# Patient Record
Sex: Male | Born: 1940 | Race: White | Hispanic: No | Marital: Married | Smoking: Former smoker
Health system: Southern US, Community
[De-identification: ages and names within clinical notes are randomized; demographics above are authoritative.]

## PROBLEM LIST (undated history)

## (undated) DIAGNOSIS — E039 Hypothyroidism, unspecified: Secondary | ICD-10-CM

## (undated) DIAGNOSIS — M109 Gout, unspecified: Secondary | ICD-10-CM

## (undated) DIAGNOSIS — E785 Hyperlipidemia, unspecified: Secondary | ICD-10-CM

## (undated) DIAGNOSIS — I10 Essential (primary) hypertension: Secondary | ICD-10-CM

## (undated) DIAGNOSIS — R3129 Other microscopic hematuria: Secondary | ICD-10-CM

## (undated) DIAGNOSIS — E291 Testicular hypofunction: Secondary | ICD-10-CM

## (undated) DIAGNOSIS — N289 Disorder of kidney and ureter, unspecified: Secondary | ICD-10-CM

## (undated) HISTORY — DX: Essential (primary) hypertension: I10

## (undated) HISTORY — PX: FLEXIBLE SIGMOIDOSCOPY: SHX1649

## (undated) HISTORY — DX: Gout, unspecified: M10.9

## (undated) HISTORY — DX: Other microscopic hematuria: R31.29

## (undated) HISTORY — DX: Hyperlipidemia, unspecified: E78.5

---

## 2000-09-26 HISTORY — PX: CHOLECYSTECTOMY: SHX55

## 2006-11-27 ENCOUNTER — Ambulatory Visit: Payer: Self-pay | Admitting: Gastroenterology

## 2006-11-27 HISTORY — PX: COLONOSCOPY: SHX174

## 2011-02-20 ENCOUNTER — Emergency Department: Payer: Self-pay | Admitting: Emergency Medicine

## 2014-10-14 DIAGNOSIS — J069 Acute upper respiratory infection, unspecified: Secondary | ICD-10-CM | POA: Diagnosis not present

## 2014-11-05 DIAGNOSIS — E079 Disorder of thyroid, unspecified: Secondary | ICD-10-CM | POA: Diagnosis not present

## 2014-11-11 DIAGNOSIS — Z23 Encounter for immunization: Secondary | ICD-10-CM | POA: Diagnosis not present

## 2014-11-11 DIAGNOSIS — Z0001 Encounter for general adult medical examination with abnormal findings: Secondary | ICD-10-CM | POA: Diagnosis not present

## 2014-11-28 DIAGNOSIS — H65193 Other acute nonsuppurative otitis media, bilateral: Secondary | ICD-10-CM | POA: Diagnosis not present

## 2014-12-02 DIAGNOSIS — J029 Acute pharyngitis, unspecified: Secondary | ICD-10-CM | POA: Diagnosis not present

## 2014-12-15 DIAGNOSIS — M10041 Idiopathic gout, right hand: Secondary | ICD-10-CM | POA: Diagnosis not present

## 2015-02-11 DIAGNOSIS — Z125 Encounter for screening for malignant neoplasm of prostate: Secondary | ICD-10-CM | POA: Diagnosis not present

## 2015-02-11 DIAGNOSIS — G8929 Other chronic pain: Secondary | ICD-10-CM | POA: Diagnosis not present

## 2015-02-11 DIAGNOSIS — M25511 Pain in right shoulder: Secondary | ICD-10-CM | POA: Diagnosis not present

## 2015-02-11 DIAGNOSIS — Z1283 Encounter for screening for malignant neoplasm of skin: Secondary | ICD-10-CM | POA: Diagnosis not present

## 2015-03-05 DIAGNOSIS — H60393 Other infective otitis externa, bilateral: Secondary | ICD-10-CM | POA: Diagnosis not present

## 2015-03-05 DIAGNOSIS — H6123 Impacted cerumen, bilateral: Secondary | ICD-10-CM | POA: Diagnosis not present

## 2015-03-05 DIAGNOSIS — H6060 Unspecified chronic otitis externa, unspecified ear: Secondary | ICD-10-CM | POA: Diagnosis not present

## 2015-05-04 DIAGNOSIS — Z125 Encounter for screening for malignant neoplasm of prostate: Secondary | ICD-10-CM | POA: Diagnosis not present

## 2015-05-04 DIAGNOSIS — I10 Essential (primary) hypertension: Secondary | ICD-10-CM | POA: Diagnosis not present

## 2015-05-04 DIAGNOSIS — E079 Disorder of thyroid, unspecified: Secondary | ICD-10-CM | POA: Diagnosis not present

## 2015-05-11 DIAGNOSIS — I10 Essential (primary) hypertension: Secondary | ICD-10-CM | POA: Diagnosis not present

## 2015-05-11 DIAGNOSIS — E079 Disorder of thyroid, unspecified: Secondary | ICD-10-CM | POA: Diagnosis not present

## 2015-05-11 DIAGNOSIS — N289 Disorder of kidney and ureter, unspecified: Secondary | ICD-10-CM | POA: Diagnosis not present

## 2015-05-26 DIAGNOSIS — M5441 Lumbago with sciatica, right side: Secondary | ICD-10-CM | POA: Diagnosis not present

## 2015-06-02 DIAGNOSIS — L821 Other seborrheic keratosis: Secondary | ICD-10-CM | POA: Diagnosis not present

## 2015-06-02 DIAGNOSIS — L570 Actinic keratosis: Secondary | ICD-10-CM | POA: Diagnosis not present

## 2015-06-02 DIAGNOSIS — X32XXXA Exposure to sunlight, initial encounter: Secondary | ICD-10-CM | POA: Diagnosis not present

## 2015-10-02 DIAGNOSIS — J069 Acute upper respiratory infection, unspecified: Secondary | ICD-10-CM | POA: Diagnosis not present

## 2015-10-02 DIAGNOSIS — B9789 Other viral agents as the cause of diseases classified elsewhere: Secondary | ICD-10-CM | POA: Diagnosis not present

## 2015-10-19 DIAGNOSIS — M109 Gout, unspecified: Secondary | ICD-10-CM | POA: Diagnosis not present

## 2015-11-09 DIAGNOSIS — E079 Disorder of thyroid, unspecified: Secondary | ICD-10-CM | POA: Diagnosis not present

## 2015-11-09 DIAGNOSIS — I10 Essential (primary) hypertension: Secondary | ICD-10-CM | POA: Diagnosis not present

## 2015-11-16 DIAGNOSIS — Z0001 Encounter for general adult medical examination with abnormal findings: Secondary | ICD-10-CM | POA: Diagnosis not present

## 2015-11-16 DIAGNOSIS — I10 Essential (primary) hypertension: Secondary | ICD-10-CM | POA: Diagnosis not present

## 2015-11-16 DIAGNOSIS — E7800 Pure hypercholesterolemia, unspecified: Secondary | ICD-10-CM | POA: Diagnosis not present

## 2015-11-16 DIAGNOSIS — D649 Anemia, unspecified: Secondary | ICD-10-CM | POA: Diagnosis not present

## 2015-11-16 DIAGNOSIS — E079 Disorder of thyroid, unspecified: Secondary | ICD-10-CM | POA: Diagnosis not present

## 2015-11-16 DIAGNOSIS — N289 Disorder of kidney and ureter, unspecified: Secondary | ICD-10-CM | POA: Diagnosis not present

## 2015-11-20 DIAGNOSIS — D649 Anemia, unspecified: Secondary | ICD-10-CM | POA: Diagnosis not present

## 2016-05-11 DIAGNOSIS — N289 Disorder of kidney and ureter, unspecified: Secondary | ICD-10-CM | POA: Diagnosis not present

## 2016-05-11 DIAGNOSIS — D649 Anemia, unspecified: Secondary | ICD-10-CM | POA: Diagnosis not present

## 2016-05-18 DIAGNOSIS — I10 Essential (primary) hypertension: Secondary | ICD-10-CM | POA: Diagnosis not present

## 2016-05-18 DIAGNOSIS — Z125 Encounter for screening for malignant neoplasm of prostate: Secondary | ICD-10-CM | POA: Diagnosis not present

## 2016-05-18 DIAGNOSIS — E7800 Pure hypercholesterolemia, unspecified: Secondary | ICD-10-CM | POA: Diagnosis not present

## 2016-05-18 DIAGNOSIS — N289 Disorder of kidney and ureter, unspecified: Secondary | ICD-10-CM | POA: Diagnosis not present

## 2016-06-06 DIAGNOSIS — M545 Low back pain: Secondary | ICD-10-CM | POA: Diagnosis not present

## 2016-06-06 DIAGNOSIS — M5136 Other intervertebral disc degeneration, lumbar region: Secondary | ICD-10-CM | POA: Diagnosis not present

## 2016-06-07 DIAGNOSIS — X32XXXA Exposure to sunlight, initial encounter: Secondary | ICD-10-CM | POA: Diagnosis not present

## 2016-06-07 DIAGNOSIS — D2261 Melanocytic nevi of right upper limb, including shoulder: Secondary | ICD-10-CM | POA: Diagnosis not present

## 2016-06-07 DIAGNOSIS — Z85828 Personal history of other malignant neoplasm of skin: Secondary | ICD-10-CM | POA: Diagnosis not present

## 2016-06-07 DIAGNOSIS — D225 Melanocytic nevi of trunk: Secondary | ICD-10-CM | POA: Diagnosis not present

## 2016-06-07 DIAGNOSIS — D2272 Melanocytic nevi of left lower limb, including hip: Secondary | ICD-10-CM | POA: Diagnosis not present

## 2016-06-07 DIAGNOSIS — L570 Actinic keratosis: Secondary | ICD-10-CM | POA: Diagnosis not present

## 2016-07-20 DIAGNOSIS — Z0000 Encounter for general adult medical examination without abnormal findings: Secondary | ICD-10-CM | POA: Diagnosis not present

## 2016-09-05 DIAGNOSIS — H109 Unspecified conjunctivitis: Secondary | ICD-10-CM | POA: Diagnosis not present

## 2016-09-07 DIAGNOSIS — I10 Essential (primary) hypertension: Secondary | ICD-10-CM | POA: Diagnosis not present

## 2016-09-07 DIAGNOSIS — B028 Zoster with other complications: Secondary | ICD-10-CM | POA: Diagnosis not present

## 2016-09-12 DIAGNOSIS — B0230 Zoster ocular disease, unspecified: Secondary | ICD-10-CM | POA: Diagnosis not present

## 2016-09-22 DIAGNOSIS — B0230 Zoster ocular disease, unspecified: Secondary | ICD-10-CM | POA: Diagnosis not present

## 2016-10-14 DIAGNOSIS — B0230 Zoster ocular disease, unspecified: Secondary | ICD-10-CM | POA: Diagnosis not present

## 2016-10-18 DIAGNOSIS — J40 Bronchitis, not specified as acute or chronic: Secondary | ICD-10-CM | POA: Diagnosis not present

## 2016-10-18 DIAGNOSIS — B028 Zoster with other complications: Secondary | ICD-10-CM | POA: Diagnosis not present

## 2016-10-18 DIAGNOSIS — I10 Essential (primary) hypertension: Secondary | ICD-10-CM | POA: Diagnosis not present

## 2016-11-11 DIAGNOSIS — Z125 Encounter for screening for malignant neoplasm of prostate: Secondary | ICD-10-CM | POA: Diagnosis not present

## 2016-11-11 DIAGNOSIS — I10 Essential (primary) hypertension: Secondary | ICD-10-CM | POA: Diagnosis not present

## 2016-11-11 DIAGNOSIS — E7800 Pure hypercholesterolemia, unspecified: Secondary | ICD-10-CM | POA: Diagnosis not present

## 2016-11-18 DIAGNOSIS — N289 Disorder of kidney and ureter, unspecified: Secondary | ICD-10-CM | POA: Diagnosis not present

## 2016-11-18 DIAGNOSIS — E034 Atrophy of thyroid (acquired): Secondary | ICD-10-CM | POA: Diagnosis not present

## 2016-11-18 DIAGNOSIS — R3121 Asymptomatic microscopic hematuria: Secondary | ICD-10-CM | POA: Diagnosis not present

## 2016-11-18 DIAGNOSIS — D649 Anemia, unspecified: Secondary | ICD-10-CM | POA: Diagnosis not present

## 2016-11-18 DIAGNOSIS — I10 Essential (primary) hypertension: Secondary | ICD-10-CM | POA: Diagnosis not present

## 2016-11-18 DIAGNOSIS — E7800 Pure hypercholesterolemia, unspecified: Secondary | ICD-10-CM | POA: Diagnosis not present

## 2016-11-18 DIAGNOSIS — Z0001 Encounter for general adult medical examination with abnormal findings: Secondary | ICD-10-CM | POA: Diagnosis not present

## 2016-11-18 DIAGNOSIS — E790 Hyperuricemia without signs of inflammatory arthritis and tophaceous disease: Secondary | ICD-10-CM | POA: Diagnosis not present

## 2016-11-18 DIAGNOSIS — E538 Deficiency of other specified B group vitamins: Secondary | ICD-10-CM | POA: Diagnosis not present

## 2016-11-22 DIAGNOSIS — M10071 Idiopathic gout, right ankle and foot: Secondary | ICD-10-CM | POA: Diagnosis not present

## 2016-12-05 ENCOUNTER — Ambulatory Visit: Payer: Self-pay | Admitting: Urology

## 2016-12-18 NOTE — Progress Notes (Signed)
12/19/2016 2:53 PM   Bryan Martin [birth year] [id number]  Referring provider: Madelyn Brunner, MD [name], [place] [address]  Chief Complaint  Patient presents with  . New Patient (Initial Visit)    microscopic hematuria referred by [name]    HPI: Patient is a 76 -year-old Caucasian male who presents today as a referral from their PCP, Dr. Lisette Grinder, for microscopic hematuria.    Patient was found to have microscopic hematuria on 11/11/2016 with 4-10 RBC's/hpf.  Patient doesn't have a prior history of microscopic hematuria.   He does not have a prior history of recurrent urinary tract infections, nephrolithiasis, trauma to the genitourinary tract, BPH or malignancies of the genitourinary tract.   He does not have a family medical history of nephrolithiasis, malignancies of the genitourinary tract or hematuria.   Today, he is having symptoms of frequent urination and nocturia.  His UA today is unremarkable.  He is not experiencing any suprapubic pain, abdominal pain or flank pain. He denies any recent fevers, chills, nausea or vomiting.   He has not had any recent imaging studies.   He is a former smoker, with a 1 ppd history.  Quit 40 years ago.  He is not exposed to secondhand smoke.  He has not worked with Sports administrator, trichloroethylene, etc.    He has HTN.    PMH: Past Medical History:  Diagnosis Date  . Gout   . HLD (hyperlipidemia)   . HTN (hypertension)   . Microscopic hematuria     Surgical History: History reviewed. No pertinent surgical history.  Home Medications:  Allergies as of 12/19/2016      Reactions   Ace Inhibitors Other (See Comments)      Medication List       Accurate as of 12/19/16  2:53 PM. Always use your most recent med list.          Acetaminophen 500 MG coapsule   allopurinol 100 MG tablet Commonly known as:  ZYLOPRIM Take by mouth.   aspirin EC 81 MG tablet Take by  mouth.   felodipine 5 MG 24 hr tablet Commonly known as:  PLENDIL TAKE 1 TABLET ONE TIME DAILY (SUBSTITUTED FOR PLENDIL)   fluticasone 50 MCG/ACT nasal spray Commonly known as:  FLONASE Place into the nose.   gabapentin 100 MG capsule Commonly known as:  NEURONTIN Take by mouth.   levothyroxine 25 MCG tablet Commonly known as:  SYNTHROID, LEVOTHROID Take by mouth.   MULTI-VITAMINS Tabs Take by mouth.   saw palmetto 500 MG capsule Take 500 mg by mouth daily.   simvastatin 40 MG tablet Commonly known as:  ZOCOR TAKE 1 TABLET EVERY NIGHT   tiZANidine 4 MG tablet Commonly known as:  ZANAFLEX Take by mouth.   valsartan-hydrochlorothiazide 160-12.5 MG tablet Commonly known as:  DIOVAN-HCT TAKE 1 TABLET EVERY DAY   VITAMIN B 12 PO Take by mouth.       Allergies:  Allergies  Allergen Reactions  . Ace Inhibitors Other (See Comments)    Family History: Family History  Problem Relation Age of Onset  . Prostate cancer Neg Hx   . Kidney cancer Neg Hx   . Bladder Cancer Neg Hx   . Kidney disease Neg Hx     Social History:  reports that he has quit smoking. He has never used smokeless tobacco. He reports that he does not drink alcohol or use drugs.  ROS:  UROLOGY Frequent Urination?: Yes Hard to postpone urination?: No Burning/pain with urination?: No Get up at night to urinate?: Yes Leakage of urine?: No Urine stream starts and stops?: No Trouble starting stream?: No Do you have to strain to urinate?: No Blood in urine?: Yes Urinary tract infection?: No Sexually transmitted disease?: No Injury to kidneys or bladder?: No Painful intercourse?: No Weak stream?: No Erection problems?: No Penile pain?: No  Gastrointestinal Nausea?: No Vomiting?: No Indigestion/heartburn?: No Diarrhea?: No Constipation?: No  Constitutional Fever: No Night sweats?: No Weight loss?: No Fatigue?: No  Skin Skin rash/lesions?: No Itching?: No  Eyes Blurred  vision?: No Double vision?: No  Ears/Nose/Throat Sore throat?: No Sinus problems?: No  Hematologic/Lymphatic Swollen glands?: No Easy bruising?: No  Cardiovascular Leg swelling?: No Chest pain?: No  Respiratory Cough?: No Shortness of breath?: No  Endocrine Excessive thirst?: No  Musculoskeletal Back pain?: No Joint pain?: No  Neurological Headaches?: No Dizziness?: No  Psychologic Depression?: No Anxiety?: No  Physical Exam: BP 119/72   Pulse 79   Ht 5' 11.5" (1.816 m)   Wt 186 lb 11.2 oz (84.7 kg)   BMI 25.68 kg/m   Constitutional: Well nourished. Alert and oriented, No acute distress. HEENT: [place] AT, moist mucus membranes. Trachea midline, no masses. Cardiovascular: No clubbing, cyanosis, or edema. Respiratory: Normal respiratory effort, no increased work of breathing. GI: Abdomen is soft, non tender, non distended, no abdominal masses. Liver and spleen not palpable.  No hernias appreciated.  Stool sample for occult testing is not indicated.   GU: No CVA tenderness.  No bladder fullness or masses.  Patient with circumcised phallus.  Urethral meatus is patent.  No penile discharge. No penile lesions or rashes. Scrotum without lesions, cysts, rashes and/or edema.  Testicles are located scrotally bilaterally. No masses are appreciated in the testicles. Left and right epididymis are normal. Rectal: Patient with  normal sphincter tone. Anus and perineum without scarring or rashes. No rectal masses are appreciated. Prostate is approximately 60 grams, no nodules are appreciated. Seminal vesicles are normal. Skin: No rashes, bruises or suspicious lesions. Lymph: No cervical or inguinal adenopathy. Neurologic: Grossly intact, no focal deficits, moving all 4 extremities. Psychiatric: Normal mood and affect.  Laboratory Data: PSA History  3.55 ng/mL on 05/04/2015  3.57 ng/mL on 11/11/2016  Urinalysis Unremarkable.  See EPIC.     Assessment & Plan:    1.  Microscopic hematuria  - I explained to the patient that there are a number of causes that can be associated with blood in the urine, such as stones, BPH, UTI's, damage to the urinary tract and/or cancer.  - At this time, I felt that the patient warranted further urologic evaluation.   The AUA guidelines state that a CT urogram is the preferred imaging study to evaluate hematuria.  - I explained to the patient that a contrast material will be injected into a vein and that in rare instances, an allergic reaction can result and may even life threatening   The patient denies any allergies to contrast, iodine and/or seafood and is not taking metformin.  - Following the imaging study,  I've recommended a cystoscopy. I described how this is performed, typically in an office setting with a flexible cystoscope. We described the risks, benefits, and possible side effects, the most common of which is a minor amount of blood in the urine and/or burning which usually resolves in 24 to 48 hours.    - The patient had the opportunity  to ask questions which were answered. Based upon this discussion, the patient is willing to proceed. Therefore, I've ordered: a CT Urogram and cystoscopy.  - The patient will return following all of the above for discussion of the results.   - UA  - Urine culture  - BUN + creatinine     Return for CT Urogram report and cystoscopy.  These notes generated with voice recognition software. I apologize for typographical errors.  Zara Council, [facility] [address], [place], [place] [address] [phone]

## 2016-12-19 ENCOUNTER — Ambulatory Visit (INDEPENDENT_AMBULATORY_CARE_PROVIDER_SITE_OTHER): Payer: Medicare HMO | Admitting: Urology

## 2016-12-19 ENCOUNTER — Encounter: Payer: Self-pay | Admitting: Urology

## 2016-12-19 VITALS — BP 119/72 | HR 79 | Ht 71.5 in | Wt 186.7 lb

## 2016-12-19 DIAGNOSIS — R3129 Other microscopic hematuria: Secondary | ICD-10-CM

## 2016-12-19 LAB — URINALYSIS, COMPLETE
Bilirubin, UA: NEGATIVE
GLUCOSE, UA: NEGATIVE
KETONES UA: NEGATIVE
NITRITE UA: NEGATIVE
PROTEIN UA: NEGATIVE
Urobilinogen, Ur: 0.2 mg/dL (ref 0.2–1.0)
pH, UA: 5.5 (ref 5.0–7.5)

## 2016-12-19 LAB — MICROSCOPIC EXAMINATION

## 2016-12-19 NOTE — Patient Instructions (Addendum)
Hematuria, Adult Hematuria is blood in your urine. It can be caused by a bladder infection, kidney infection, prostate infection, kidney stone, or cancer of your urinary tract. Infections can usually be treated with medicine, and a kidney stone usually will pass through your urine. If neither of these is the cause of your hematuria, further workup to find out the reason may be needed. It is very important that you tell your health care provider about any blood you see in your urine, even if the blood stops without treatment or happens without causing pain. Blood in your urine that happens and then stops and then happens again can be a symptom of a very serious condition. Also, pain is not a symptom in the initial stages of many urinary cancers. Follow these instructions at home:  Drink lots of fluid, 3-4 quarts a day. If you have been diagnosed with an infection, cranberry juice is especially recommended, in addition to large amounts of water.  Avoid caffeine, tea, and carbonated beverages because they tend to irritate the bladder.  Avoid alcohol because it may irritate the prostate.  Take all medicines as directed by your health care provider.  If you were prescribed an antibiotic medicine, finish it all even if you start to feel better.  If you have been diagnosed with a kidney stone, follow your health care provider's instructions regarding straining your urine to catch the stone.  Empty your bladder often. Avoid holding urine for long periods of time.  After a bowel movement, women should cleanse front to back. Use each tissue only once.  Empty your bladder before and after sexual intercourse if you are a male. Contact a health care provider if:  You develop back pain.  You have a fever.  You have a feeling of sickness in your stomach (nausea) or vomiting.  Your symptoms are not better in 3 days. Return sooner if you are getting worse. Get help right away if:  You develop  severe vomiting and are unable to keep the medicine down.  You develop severe back or abdominal pain despite taking your medicines.  You begin passing a large amount of blood or clots in your urine.  You feel extremely weak or faint, or you pass out. This information is not intended to replace advice given to you by your health care provider. Make sure you discuss any questions you have with your health care provider. Document Released: 09/12/2005 Document Revised: 02/18/2016 Document Reviewed: 05/13/2013 Elsevier Interactive Patient Education  2017 [place] scan is a kind of X-ray. A CT scan makes pictures of the inside of your body. In this procedure, the pictures will be taken in a large machine that has an opening (CT scanner). What happens before the procedure? Staying hydrated  Follow instructions from your doctor about hydration, which may include:  Up to 2 hours before the procedure - you may continue to drink clear liquids. These include water, clear fruit juice, black coffee, and plain tea. Eating and drinking restrictions  Follow instructions from your doctor about eating and drinking, which may include:  24 hours before the procedure - stop drinking caffeinated drinks. These include energy drinks, tea, soda, coffee, and hot chocolate.  8 hours before the procedure - stop eating heavy meals or foods. These include meat, fried foods, or fatty foods.  6 hours before the procedure - stop eating light meals or foods. These include toast or cereal.  6 hours before the procedure -  stop drinking milk or drinks that have milk in them.  2 hours before the procedure - stop drinking clear liquids. General instructions   Take off any jewelry.  Ask your doctor about changing or stopping your normal medicines. This is important if you take diabetes medicines or blood thinners. What happens during the procedure?  You will lie on a table with your arms above your  head.  An IV tube may be put into one of your veins.  Dye may be put into the IV tube. You may feel warm or have a metal taste in your mouth.  The table you will be lying on will move into the CT scanner.  You will be able to see, hear, and talk to the person who is running the machine while you are in it. Follow that person's directions.  The machine will move around you to take pictures. Do not move.  When the machine is done taking pictures, it will be turned off.  The table will be moved out of the machine.  Your IV tube will be taken out. The procedure may vary among doctors and hospitals. What happens after the procedure?  It is up to you to get your results. Ask when your results will be ready. Summary  A CT scan is a kind of X-ray.  A CT scan makes pictures of the inside of your body.  Follow instructions from your doctor about eating and drinking before the procedure.  You will be able to see, hear, and talk to the person who is running the machine while you are in it. Follow that person's directions. This information is not intended to replace advice given to you by your health care provider. Make sure you discuss any questions you have with your health care provider. Document Released: 12/09/2008 Document Revised: 09/29/2016 Document Reviewed: 09/29/2016 Elsevier Interactive Patient Education  2017 [place].  Cystoscopy Cystoscopy is a procedure that is used to help diagnose and sometimes treat conditions that affect that lower urinary tract. The lower urinary tract includes the bladder and the tube that drains urine from the bladder out of the body (urethra). Cystoscopy is performed with a thin, tube-shaped instrument with a light and camera at the end (cystoscope). The cystoscope may be hard (rigid) or flexible, depending on the goal of the procedure.The cystoscope is inserted through the urethra, into the bladder. Cystoscopy may be recommended if you  have:  Urinary tractinfections that keep coming back (recurring).  Blood in the urine (hematuria).  Loss of bladder control (urinary incontinence) or an overactive bladder.  Unusual cells found in a urine sample.  A blockage in the urethra.  Painful urination.  An abnormality in the bladder found during an intravenous pyelogram (IVP) or CT scan. Cystoscopy may also be done to remove a sample of tissue to be examined under a microscope (biopsy). Tell a health care provider about:  Any allergies you have.  All medicines you are taking, including vitamins, herbs, eye drops, creams, and over-the-counter medicines.  Any problems you or family members have had with anesthetic medicines.  Any blood disorders you have.  Any surgeries you have had.  Any medical conditions you have.  Whether you are pregnant or may be pregnant. What are the risks? Generally, this is a safe procedure. However, problems may occur, including:  Infection.  Bleeding.  Allergic reactions to medicines.  Damage to other structures or organs. What happens before the procedure?  Ask your health care provider  about:  Changing or stopping your regular medicines. This is especially important if you are taking diabetes medicines or blood thinners.  Taking medicines such as aspirin and ibuprofen. These medicines can thin your blood. Do not take these medicines before your procedure if your health care provider instructs you not to.  Follow instructions from your health care provider about eating or drinking restrictions.  You may be given antibiotic medicine to help prevent infection.  You may have an exam or testing, such as X-rays of the bladder, urethra, or kidneys.  You may have urine tests to check for signs of infection.  Plan to have someone take you home after the procedure. What happens during the procedure?  To reduce your risk of infection,your health care team will wash or sanitize  their hands.  You will be given one or more of the following:  A medicine to help you relax (sedative).  A medicine to numb the area (local anesthetic).  The area around the opening of your urethra will be cleaned.  The cystoscope will be passed through your urethra into your bladder.  Germ-free (sterile)fluid will flow through the cystoscope to fill your bladder. The fluid will stretch your bladder so that your surgeon can clearly examine your bladder walls.  The cystoscope will be removed and your bladder will be emptied. The procedure may vary among health care providers and hospitals. What happens after the procedure?  You may have some soreness or pain in your abdomen and urethra. Medicines will be available to help you.  You may have some blood in your urine.  Do not drive for 24 hours if you received a sedative. This information is not intended to replace advice given to you by your health care provider. Make sure you discuss any questions you have with your health care provider. Document Released: 09/09/2000 Document Revised: 01/21/2016 Document Reviewed: 07/30/2015 Elsevier Interactive Patient Education  2017 [employer].

## 2016-12-20 LAB — BUN+CREAT
BUN/Creatinine Ratio: 15 (ref 10–24)
BUN: 26 mg/dL (ref 8–27)
Creatinine, Ser: 1.73 mg/dL — ABNORMAL HIGH (ref 0.76–1.27)
GFR calc Af Amer: 44 mL/min/{1.73_m2} — ABNORMAL LOW (ref >59)
GFR, EST NON AFRICAN AMERICAN: 38 mL/min/{1.73_m2} — AB (ref >59)

## 2016-12-22 LAB — CULTURE, URINE COMPREHENSIVE

## 2016-12-27 ENCOUNTER — Ambulatory Visit
Admission: RE | Admit: 2016-12-27 | Discharge: 2016-12-27 | Disposition: A | Discharge status: Home / Self Care | Payer: Medicare HMO | Source: Ambulatory Visit | Attending: Urology | Admitting: Urology

## 2016-12-27 DIAGNOSIS — R3129 Other microscopic hematuria: Secondary | ICD-10-CM | POA: Diagnosis not present

## 2016-12-27 DIAGNOSIS — I700 Atherosclerosis of aorta: Secondary | ICD-10-CM | POA: Diagnosis not present

## 2016-12-27 DIAGNOSIS — N400 Enlarged prostate without lower urinary tract symptoms: Secondary | ICD-10-CM | POA: Diagnosis not present

## 2016-12-27 DIAGNOSIS — N281 Cyst of kidney, acquired: Secondary | ICD-10-CM | POA: Diagnosis not present

## 2016-12-27 MED ORDER — IOPAMIDOL (ISOVUE-300) INJECTION 61%
100.0000 mL | Freq: Once | INTRAVENOUS | Status: AC | PRN
  Administered 2016-12-27: 100 mL via INTRAVENOUS

## 2017-01-04 DIAGNOSIS — B0230 Zoster ocular disease, unspecified: Secondary | ICD-10-CM | POA: Diagnosis not present

## 2017-01-11 ENCOUNTER — Ambulatory Visit: Payer: Medicare HMO | Admitting: Urology

## 2017-01-11 ENCOUNTER — Encounter: Payer: Self-pay | Admitting: Urology

## 2017-01-11 VITALS — BP 115/70 | HR 78 | Ht 71.0 in | Wt 186.0 lb

## 2017-01-11 DIAGNOSIS — R3129 Other microscopic hematuria: Secondary | ICD-10-CM

## 2017-01-11 LAB — URINALYSIS, COMPLETE
BILIRUBIN UA: NEGATIVE
Glucose, UA: NEGATIVE
Ketones, UA: NEGATIVE
Leukocytes, UA: NEGATIVE
NITRITE UA: NEGATIVE
Protein, UA: NEGATIVE
Specific Gravity, UA: 1.01 (ref 1.005–1.030)
UUROB: 0.2 mg/dL (ref 0.2–1.0)
pH, UA: 5.5 (ref 5.0–7.5)

## 2017-01-11 MED ORDER — CIPROFLOXACIN HCL 500 MG PO TABS
500.0000 mg | ORAL_TABLET | Freq: Once | ORAL | Status: AC
  Administered 2017-01-11: 500 mg via ORAL

## 2017-01-11 MED ORDER — LIDOCAINE HCL 2 % EX GEL
1.0000 "application " | Freq: Once | CUTANEOUS | Status: AC
  Administered 2017-01-11: 1 via URETHRAL

## 2017-01-11 NOTE — Progress Notes (Addendum)
   01/11/17  CC:  Chief Complaint  Patient presents with  . Cysto    microscopic hematuria     HPI: The patient is a 76 year old male presents today for completion of the microscopic hematuria workup. No history of gross hematuria. CT Urogram negative for source. CT did reveal nonmalignant renal cyst.  There were no vitals taken for this visit. NED. A&Ox3.   No respiratory distress   Abd soft, NT, ND Normal external genitalia with patent urethral meatus  Cystoscopy Procedure Note  Patient identification was confirmed, informed consent was obtained, and patient was prepped using Betadine solution.  Lidocaine jelly was administered per urethral meatus.    Preoperative abx where received prior to procedure.     Pre-Procedure: - Inspection reveals a normal caliber ureteral meatus.  Procedure: The flexible cystoscope was introduced without difficulty - No urethral strictures/lesions are present. - Enlarged prostate Visually obstructive - 5 cm - Normal bladder neck - Bilateral ureteral orifices identified - Bladder mucosa  reveals no ulcers, tumors, or lesions - No bladder stones - No trabeculation  Retroflexion shows no intravesical lobe   Post-Procedure: - Patient tolerated the procedure well  Assessment/ Plan:  1. Microscopic hematuria -Negative work up. Follow up for repeat urinalysis in one year.  2. Bilateral renal cysts -benign. No further work up  Nickie Retort, MD

## 2017-01-30 DIAGNOSIS — M109 Gout, unspecified: Secondary | ICD-10-CM | POA: Diagnosis not present

## 2017-02-17 ENCOUNTER — Other Ambulatory Visit: Payer: Self-pay | Admitting: Gastroenterology

## 2017-02-17 DIAGNOSIS — R0989 Other specified symptoms and signs involving the circulatory and respiratory systems: Secondary | ICD-10-CM

## 2017-02-17 DIAGNOSIS — Z1211 Encounter for screening for malignant neoplasm of colon: Secondary | ICD-10-CM | POA: Diagnosis not present

## 2017-02-23 ENCOUNTER — Ambulatory Visit
Admission: RE | Admit: 2017-02-23 | Discharge: 2017-02-23 | Disposition: A | Discharge status: Home / Self Care | Payer: Medicare HMO | Source: Ambulatory Visit | Attending: Gastroenterology | Admitting: Gastroenterology

## 2017-02-23 DIAGNOSIS — R0989 Other specified symptoms and signs involving the circulatory and respiratory systems: Secondary | ICD-10-CM | POA: Insufficient documentation

## 2017-02-23 DIAGNOSIS — N281 Cyst of kidney, acquired: Secondary | ICD-10-CM | POA: Insufficient documentation

## 2017-05-19 DIAGNOSIS — E7800 Pure hypercholesterolemia, unspecified: Secondary | ICD-10-CM | POA: Diagnosis not present

## 2017-05-19 DIAGNOSIS — D649 Anemia, unspecified: Secondary | ICD-10-CM | POA: Diagnosis not present

## 2017-05-19 DIAGNOSIS — I10 Essential (primary) hypertension: Secondary | ICD-10-CM | POA: Diagnosis not present

## 2017-05-19 DIAGNOSIS — E034 Atrophy of thyroid (acquired): Secondary | ICD-10-CM | POA: Diagnosis not present

## 2017-05-26 DIAGNOSIS — E034 Atrophy of thyroid (acquired): Secondary | ICD-10-CM | POA: Diagnosis not present

## 2017-05-26 DIAGNOSIS — I10 Essential (primary) hypertension: Secondary | ICD-10-CM | POA: Diagnosis not present

## 2017-05-30 DIAGNOSIS — X32XXXA Exposure to sunlight, initial encounter: Secondary | ICD-10-CM | POA: Diagnosis not present

## 2017-05-30 DIAGNOSIS — D2261 Melanocytic nevi of right upper limb, including shoulder: Secondary | ICD-10-CM | POA: Diagnosis not present

## 2017-05-30 DIAGNOSIS — Z85828 Personal history of other malignant neoplasm of skin: Secondary | ICD-10-CM | POA: Diagnosis not present

## 2017-05-30 DIAGNOSIS — L570 Actinic keratosis: Secondary | ICD-10-CM | POA: Diagnosis not present

## 2017-05-30 DIAGNOSIS — D2272 Melanocytic nevi of left lower limb, including hip: Secondary | ICD-10-CM | POA: Diagnosis not present

## 2017-05-30 DIAGNOSIS — D2262 Melanocytic nevi of left upper limb, including shoulder: Secondary | ICD-10-CM | POA: Diagnosis not present

## 2017-06-06 DIAGNOSIS — J069 Acute upper respiratory infection, unspecified: Secondary | ICD-10-CM | POA: Diagnosis not present

## 2017-06-06 DIAGNOSIS — I10 Essential (primary) hypertension: Secondary | ICD-10-CM | POA: Diagnosis not present

## 2017-06-19 DIAGNOSIS — I10 Essential (primary) hypertension: Secondary | ICD-10-CM | POA: Diagnosis not present

## 2017-06-22 DIAGNOSIS — I10 Essential (primary) hypertension: Secondary | ICD-10-CM | POA: Diagnosis not present

## 2017-06-22 DIAGNOSIS — N289 Disorder of kidney and ureter, unspecified: Secondary | ICD-10-CM | POA: Diagnosis not present

## 2017-06-23 DIAGNOSIS — J069 Acute upper respiratory infection, unspecified: Secondary | ICD-10-CM | POA: Diagnosis not present

## 2017-06-23 DIAGNOSIS — M109 Gout, unspecified: Secondary | ICD-10-CM | POA: Diagnosis not present

## 2017-06-26 IMAGING — CT CT ABD-PEL WO/W CM
2 of 6 series · 13 of 32 positions shown, 18 images · IV contrast (APPLIED)
Comparison: None.

CLINICAL DATA: Microscopic hematuria

EXAM:
CT ABDOMEN AND PELVIS WITHOUT AND WITH CONTRAST
TECHNIQUE: Multidetector CT imaging of the abdomen and pelvis was performed
following the standard protocol before and following the bolus
administration of intravenous contrast.
CONTRAST:  100mL [id number] IOPAMIDOL ([id number]) INJECTION 61%

[Series 2: axial pre · axial · non-contrast · 0.75mm/px · z∈[-943,-618]mm · 6 of 92 slices shown]
[im 14/92  soft-tissue]
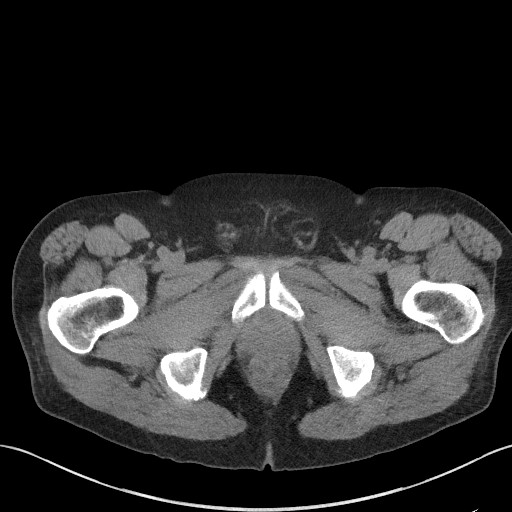
[im 27/92  soft-tissue]
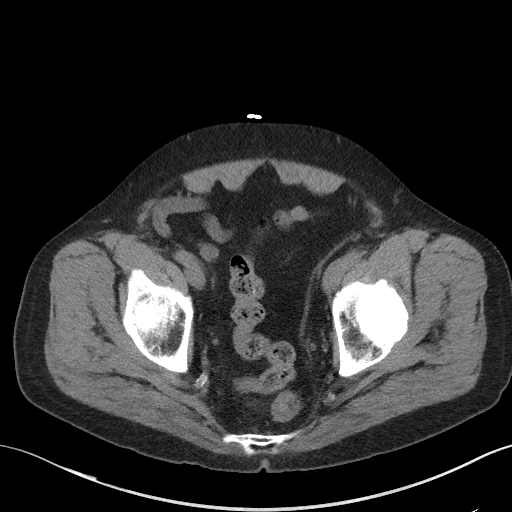
[im 40/92  soft-tissue]
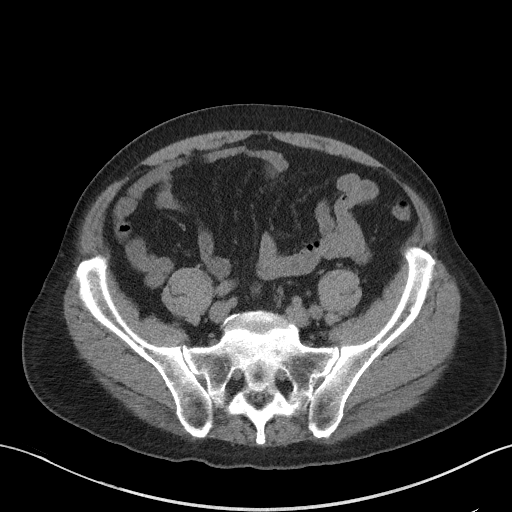
[im 53/92  soft-tissue]
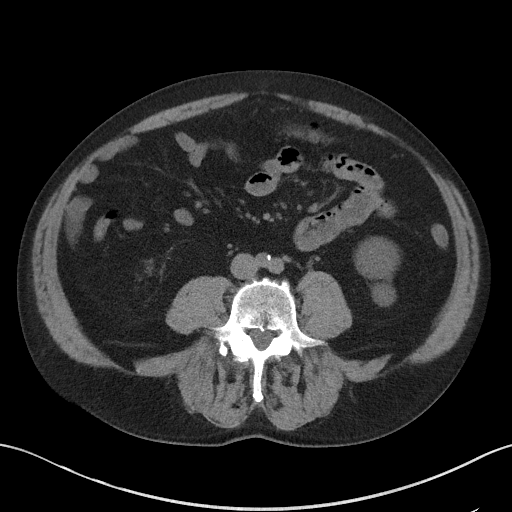
[im 66/92  soft-tissue]
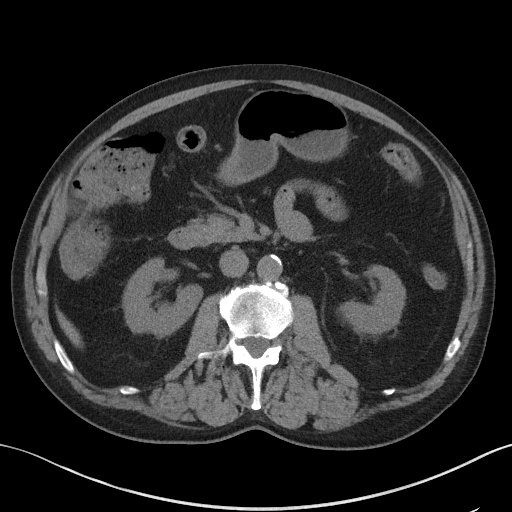
[im 79/92  soft-tissue]
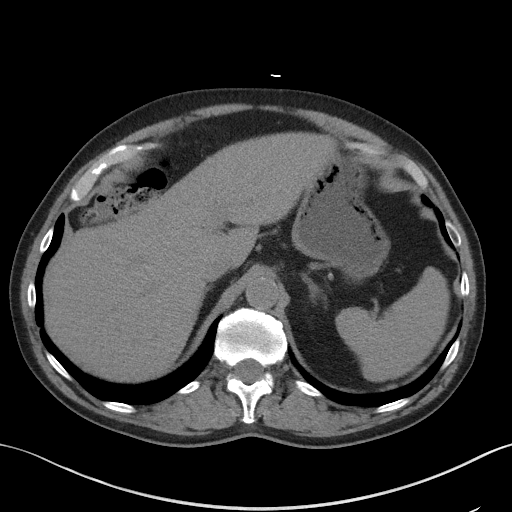

[Series 12: axial delay · axial · delayed · 0.75mm/px · z∈[-896,-531]mm · 7 of 99 slices shown, 12 images]
[im 13/99  soft-tissue]
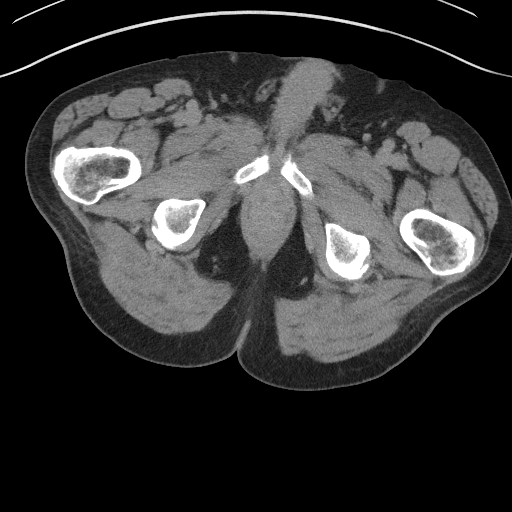
[im 13/99  bone]
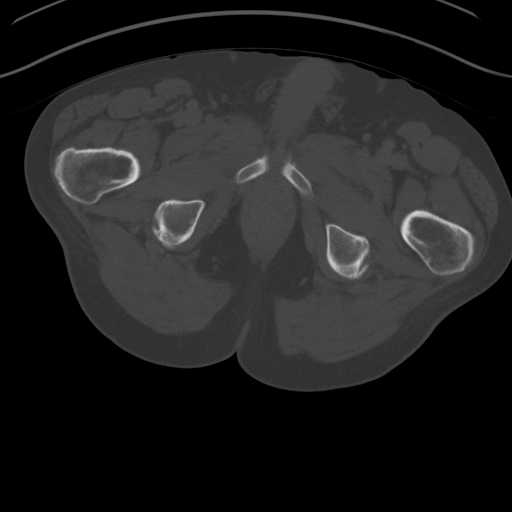
[im 25/99  soft-tissue]
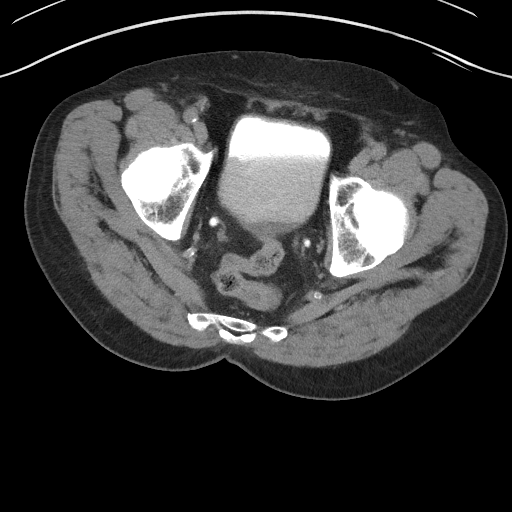
[im 37/99  soft-tissue]
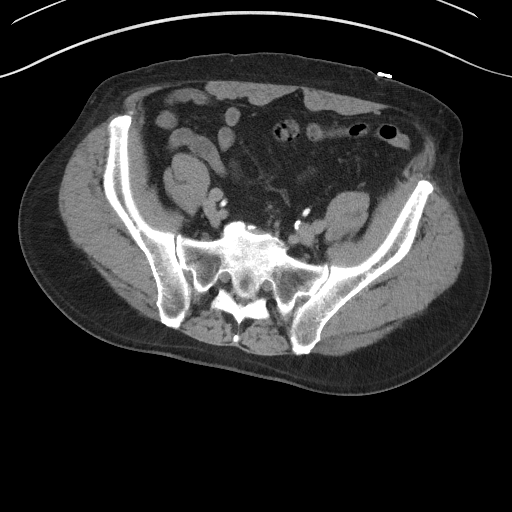
[im 50/99  soft-tissue]
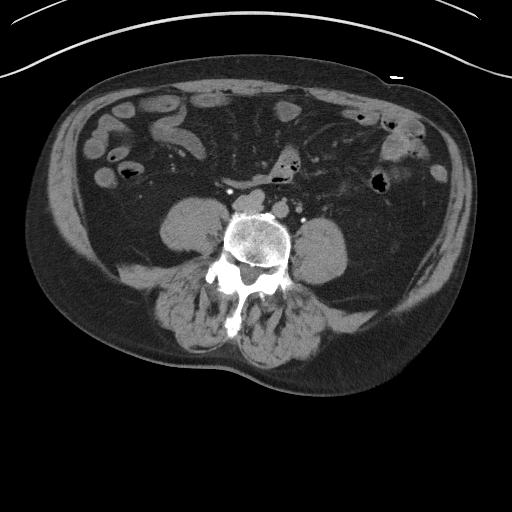
[im 50/99  lung]
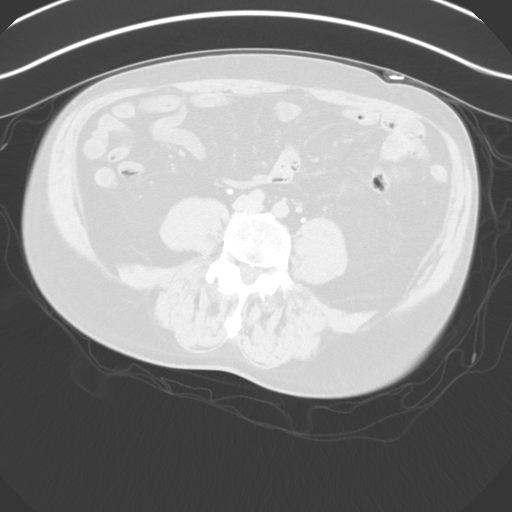
[im 62/99  soft-tissue]
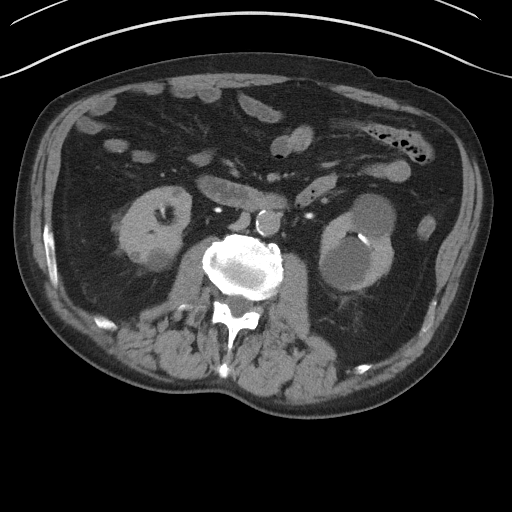
[im 62/99  lung]
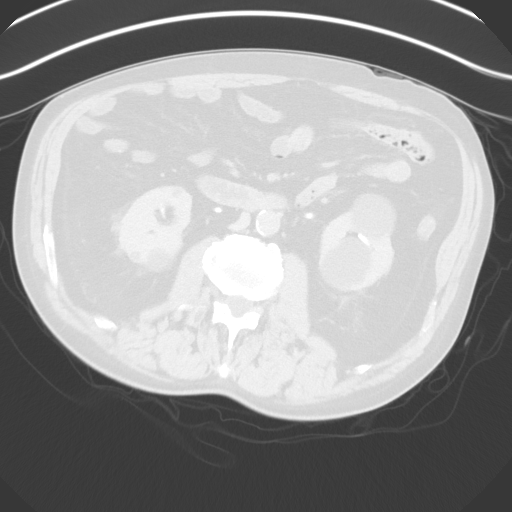
[im 74/99  soft-tissue]
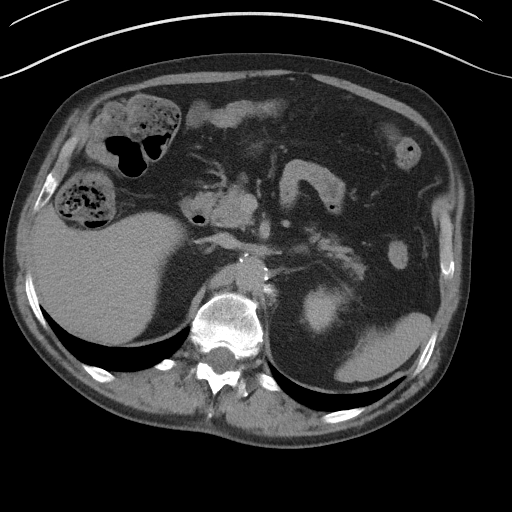
[im 74/99  lung]
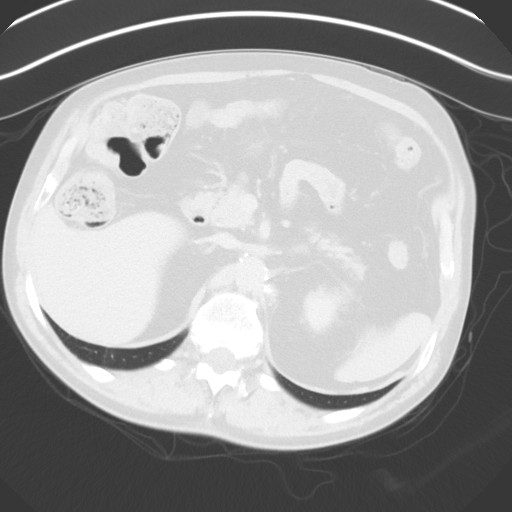
[im 86/99  soft-tissue]
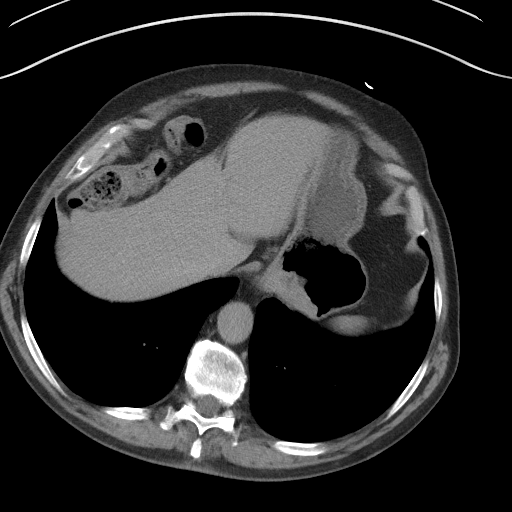
[im 86/99  lung]
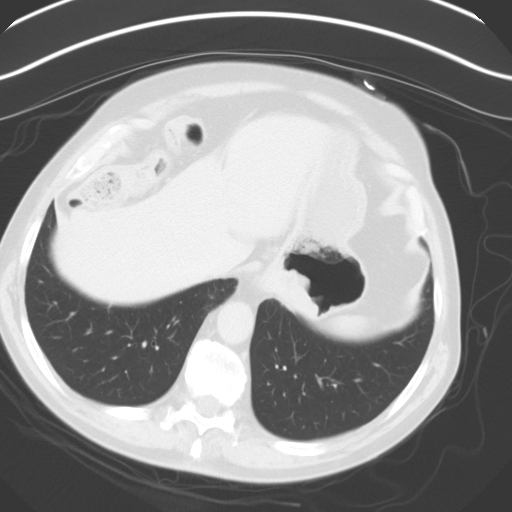

[13 of 32 positions shown; findings below may reference images not displayed]

FINDINGS: Lower chest:  Unremarkable.

Hepatobiliary: No focal abnormality within the liver parenchyma.
Gallbladder surgically absent. No intrahepatic or extrahepatic
biliary dilation.

Pancreas: No focal mass lesion. No dilatation of the main duct. No
intraparenchymal cyst. No peripancreatic edema.

Spleen: No splenomegaly. No focal mass lesion.

Adrenals/Urinary Tract: No adrenal nodule or mass. Precontrast
imaging shows no stones in either kidney or ureter. No bladder
stones.

Imaging after IV contrast administration no enhancing mass is
identified in either kidney. Multiple cysts are identified in the
right kidney ranging in size from 4 mm up to the dominant interpolar
lesion measuring 24 mm. These are all well-defined, homogeneous ,
and show no evidence of enhancement after IV contrast
administration, compatible with cyst.

No enhancing lesion identified left kidney. Multiple left renal
cysts are also evident ranging in size from 7 mm up to a dominant
4.8 cm exophytic lower pole lesion. The cysts have varying
attenuation on precontrast imaging but are homogeneous and 9 show
enhancement characteristics after contrast administration. 12 mm
exophytic upper pole lesion has increased attenuation compatible
with hemorrhagic or proteinaceous cyst (Bosniak II).

Delayed imaging shows no abnormality of either intrarenal collecting
system or renal pelvis. Ureters are unremarkable. No focal bladder
wall abnormality is evident with limited assessment of a small area
of the posterior bladder wall secondary to adjacent non-opacified
urine.

Stomach/Bowel: Stomach is nondistended. No gastric wall thickening.
No evidence of outlet obstruction. Duodenum is normally positioned
as is the ligament of Treitz. No small bowel wall thickening. No
small bowel dilatation. The terminal ileum is normal. The appendix
is normal. No gross colonic mass. No colonic wall thickening. No
substantial diverticular change.

Vascular/Lymphatic: There is abdominal aortic atherosclerosis
without aneurysm. There is no gastrohepatic or hepatoduodenal
ligament lymphadenopathy. No intraperitoneal or retroperitoneal
lymphadenopathy. No pelvic sidewall lymphadenopathy.

Reproductive: Prostate gland appears mildly enlarged.

Other: No intraperitoneal free fluid.

Musculoskeletal: Bone windows reveal no worrisome lytic or sclerotic
osseous lesions.
IMPRESSION: 1. No definite CT findings to explain the patient's history of
microhematuria. Bilateral renal cysts, some of which are complicated
by proteinaceous debris or hemorrhage, but no enhancing lesion in
either kidney. No renal stone disease. No ureteral or bladder
abnormality.
2.  Abdominal Aortic Atherosclerois ([id number])
3. Prostatomegaly.

## 2017-07-05 ENCOUNTER — Encounter: Payer: Self-pay | Admitting: *Deleted

## 2017-07-05 ENCOUNTER — Ambulatory Visit: Payer: Medicare HMO | Admitting: Anesthesiology

## 2017-07-05 ENCOUNTER — Encounter
Admission: RE | Disposition: A | Discharge status: Home / Self Care | Payer: Self-pay | Source: Ambulatory Visit | Attending: Internal Medicine

## 2017-07-05 ENCOUNTER — Ambulatory Visit
Admission: RE | Admit: 2017-07-05 | Discharge: 2017-07-05 | Disposition: A | Discharge status: Home / Self Care | Payer: Medicare HMO | Source: Ambulatory Visit | Attending: Internal Medicine | Admitting: Internal Medicine

## 2017-07-05 DIAGNOSIS — Z1211 Encounter for screening for malignant neoplasm of colon: Secondary | ICD-10-CM | POA: Diagnosis not present

## 2017-07-05 DIAGNOSIS — I10 Essential (primary) hypertension: Secondary | ICD-10-CM | POA: Insufficient documentation

## 2017-07-05 DIAGNOSIS — K648 Other hemorrhoids: Secondary | ICD-10-CM | POA: Diagnosis not present

## 2017-07-05 DIAGNOSIS — K640 First degree hemorrhoids: Secondary | ICD-10-CM | POA: Diagnosis not present

## 2017-07-05 DIAGNOSIS — E039 Hypothyroidism, unspecified: Secondary | ICD-10-CM | POA: Insufficient documentation

## 2017-07-05 DIAGNOSIS — Z7982 Long term (current) use of aspirin: Secondary | ICD-10-CM | POA: Insufficient documentation

## 2017-07-05 DIAGNOSIS — Z888 Allergy status to other drugs, medicaments and biological substances status: Secondary | ICD-10-CM | POA: Diagnosis not present

## 2017-07-05 DIAGNOSIS — M109 Gout, unspecified: Secondary | ICD-10-CM | POA: Diagnosis not present

## 2017-07-05 DIAGNOSIS — Z79899 Other long term (current) drug therapy: Secondary | ICD-10-CM | POA: Insufficient documentation

## 2017-07-05 DIAGNOSIS — E291 Testicular hypofunction: Secondary | ICD-10-CM | POA: Diagnosis not present

## 2017-07-05 DIAGNOSIS — E785 Hyperlipidemia, unspecified: Secondary | ICD-10-CM | POA: Insufficient documentation

## 2017-07-05 HISTORY — DX: Testicular hypofunction: E29.1

## 2017-07-05 HISTORY — PX: COLONOSCOPY WITH PROPOFOL: SHX5780

## 2017-07-05 HISTORY — DX: Disorder of kidney and ureter, unspecified: N28.9

## 2017-07-05 HISTORY — DX: Hypothyroidism, unspecified: E03.9

## 2017-07-05 SURGERY — COLONOSCOPY WITH PROPOFOL
Anesthesia: General

## 2017-07-05 MED ORDER — MIDAZOLAM HCL 2 MG/2ML IJ SOLN
INTRAMUSCULAR | Status: DC | PRN
  Administered 2017-07-05: 2 mg via INTRAVENOUS

## 2017-07-05 MED ORDER — PROPOFOL 500 MG/50ML IV EMUL
INTRAVENOUS | Status: AC
  Filled 2017-07-05: qty 50

## 2017-07-05 MED ORDER — SODIUM CHLORIDE 0.9 % IV SOLN
INTRAVENOUS | Status: DC
  Administered 2017-07-05: 13:00:00 via INTRAVENOUS

## 2017-07-05 MED ORDER — LIDOCAINE HCL (PF) 2 % IJ SOLN
INTRAMUSCULAR | Status: AC
  Filled 2017-07-05: qty 10

## 2017-07-05 MED ORDER — FENTANYL CITRATE (PF) 100 MCG/2ML IJ SOLN
INTRAMUSCULAR | Status: DC | PRN
  Administered 2017-07-05: 50 ug via INTRAVENOUS

## 2017-07-05 MED ORDER — PROPOFOL 500 MG/50ML IV EMUL
INTRAVENOUS | Status: DC | PRN
  Administered 2017-07-05: 100 ug/kg/min via INTRAVENOUS

## 2017-07-05 MED ORDER — GLYCOPYRROLATE 0.2 MG/ML IJ SOLN
INTRAMUSCULAR | Status: AC
  Filled 2017-07-05: qty 1

## 2017-07-05 MED ORDER — FENTANYL CITRATE (PF) 100 MCG/2ML IJ SOLN
INTRAMUSCULAR | Status: AC
  Filled 2017-07-05: qty 2

## 2017-07-05 MED ORDER — MIDAZOLAM HCL 2 MG/2ML IJ SOLN
INTRAMUSCULAR | Status: AC
  Filled 2017-07-05: qty 2

## 2017-07-05 NOTE — Transfer of Care (Signed)
Immediate Anesthesia Transfer of Care Note  Patient: Bryan Martin  Procedure(s) Performed: COLONOSCOPY WITH PROPOFOL (N/A )  Patient Location: PACU  Anesthesia Type:General  Level of Consciousness: awake and sedated  Airway & Oxygen Therapy: Patient Spontanous Breathing and Patient connected to nasal cannula oxygen  Post-op Assessment: Report given to RN and Post -op Vital signs reviewed and stable  Post vital signs: Reviewed and stable  Last Vitals:  Vitals:   07/05/17 1224  BP: (!) 149/71  Pulse: 76  Resp: 18  Temp: 36.5 C  SpO2: 100%    Last Pain:  Vitals:   07/05/17 1224  TempSrc: Tympanic         Complications: No apparent anesthesia complications

## 2017-07-05 NOTE — Anesthesia Post-op Follow-up Note (Signed)
Anesthesia QCDR form completed.        

## 2017-07-05 NOTE — Anesthesia Preprocedure Evaluation (Deleted)
Anesthesia Evaluation  Patient identified by MRN, date of birth, ID band Patient awake    Reviewed: Allergy & Precautions, NPO status , Patient's Chart, lab work & pertinent test results  History of Anesthesia Complications Negative for: history of anesthetic complications  Airway Mallampati: III       Dental   Pulmonary neg sleep apnea, neg COPD, former smoker,           Cardiovascular hypertension, Pt. on medications (-) Past MI and (-) CHF (-) dysrhythmias (-) Valvular Problems/Murmurs     Neuro/Psych neg Seizures CVA (Cerebellar, balnce difficulties)    GI/Hepatic negative GI ROS, Neg liver ROS,   Endo/Other  neg diabetesHypothyroidism   Renal/GU Renal InsufficiencyRenal disease     Musculoskeletal   Abdominal   Peds  Hematology   Anesthesia Other Findings   Reproductive/Obstetrics                             Anesthesia Physical Anesthesia Plan  ASA: III  Anesthesia Plan: General   Post-op Pain Management:    Induction: Intravenous  PONV Risk Score and Plan:   Airway Management Planned: Nasal Cannula  Additional Equipment:   Intra-op Plan:   Post-operative Plan:   Informed Consent: I have reviewed the patients History and Physical, chart, labs and discussed the procedure including the risks, benefits and alternatives for the proposed anesthesia with the patient or authorized representative who has indicated his/her understanding and acceptance.     Plan Discussed with:   Anesthesia Plan Comments:         Anesthesia Quick Evaluation

## 2017-07-05 NOTE — Interval H&P Note (Signed)
History and Physical Interval Note:  07/05/2017 1:59 PM  Bryan Martin  has presented today for surgery, with the diagnosis of SCREENING  The various methods of treatment have been discussed with the patient and family. After consideration of risks, benefits and other options for treatment, the patient has consented to  Procedure(s): COLONOSCOPY WITH PROPOFOL (N/A) as a surgical intervention .  The patient's history has been reviewed, patient examined, no change in status, stable for surgery.  I have reviewed the patient's chart and labs.  Questions were answered to the patient's satisfaction.     [place], [place]

## 2017-07-05 NOTE — Anesthesia Preprocedure Evaluation (Signed)
Anesthesia Evaluation  Patient identified by MRN, date of birth, ID band Patient awake    Reviewed: Allergy & Precautions, NPO status , Patient's Chart, lab work & pertinent test results  History of Anesthesia Complications Negative for: history of anesthetic complications  Airway Mallampati: II       Dental   Pulmonary neg sleep apnea, neg COPD, former smoker,           Cardiovascular hypertension, Pt. on medications (-) Past MI and (-) CHF (-) dysrhythmias (-) Valvular Problems/Murmurs     Neuro/Psych neg Seizures    GI/Hepatic Neg liver ROS, neg GERD  ,  Endo/Other  Hypothyroidism   Renal/GU Renal InsufficiencyRenal disease     Musculoskeletal   Abdominal   Peds  Hematology   Anesthesia Other Findings   Reproductive/Obstetrics                             Anesthesia Physical Anesthesia Plan  ASA: III  Anesthesia Plan: General   Post-op Pain Management:    Induction: Intravenous  PONV Risk Score and Plan:   Airway Management Planned: Nasal Cannula  Additional Equipment:   Intra-op Plan:   Post-operative Plan:   Informed Consent: I have reviewed the patients History and Physical, chart, labs and discussed the procedure including the risks, benefits and alternatives for the proposed anesthesia with the patient or authorized representative who has indicated his/her understanding and acceptance.     Plan Discussed with:   Anesthesia Plan Comments:         Anesthesia Quick Evaluation

## 2017-07-05 NOTE — Anesthesia Postprocedure Evaluation (Signed)
Anesthesia Post Note  Patient: Bryan Martin  Procedure(s) Performed: COLONOSCOPY WITH PROPOFOL (N/A )  Patient location during evaluation: Endoscopy Anesthesia Type: General Level of consciousness: awake and alert Pain management: pain level controlled Vital Signs Assessment: post-procedure vital signs reviewed and stable Respiratory status: spontaneous breathing and respiratory function stable Cardiovascular status: stable Anesthetic complications: no     Last Vitals:  Vitals:   07/05/17 1224 07/05/17 1423  BP: (!) 149/71   Pulse: 76   Resp: 18   Temp: 36.5 C (!) 36 C  SpO2: 100%     Last Pain:  Vitals:   07/05/17 1423  TempSrc: Tympanic                 KEPHART,WILLIAM K

## 2017-07-05 NOTE — H&P (Signed)
Outpatient short stay form Pre-procedure 07/05/2017 1:58 PM Savanah Bayles K. Alice Reichert, M.D.  Primary Physician: Hewitt Blade. Gilford Rile, M.D.  Reason for visit:  Colon cancer screening, average risk  History of present illness:  76 y/o male presents for elective colonoscopy for colon cancer screening. Patient denies abdominal pain, weight loss or hematochezia.    Current Facility-Administered Medications:  .  0.9 %  sodium chloride infusion, , Intravenous, Continuous, Lollie Sails, MD, Last Rate: 20 mL/hr at 07/05/17 1256  Prescriptions Prior to Admission  Medication Sig Dispense Refill Last Dose  . allopurinol (ZYLOPRIM) 100 MG tablet Take by mouth.   07/05/2017 at Unknown time  . aspirin EC 81 MG tablet Take by mouth.   07/05/2017 at Unknown time  . Cyanocobalamin (VITAMIN B 12 PO) Take by mouth.   07/05/2017 at Unknown time  . felodipine (PLENDIL) 5 MG 24 hr tablet TAKE 1 TABLET ONE TIME DAILY (SUBSTITUTED FOR PLENDIL)   07/04/2017 at Unknown time  . saw palmetto 500 MG capsule Take 500 mg by mouth daily.   07/05/2017 at Unknown time  . Acetaminophen 500 MG coapsule    Not Taking at Unknown time  . fluticasone (FLONASE) 50 MCG/ACT nasal spray Place into the nose.   Not Taking at Unknown time  . gabapentin (NEURONTIN) 100 MG capsule Take by mouth.   Not Taking at Unknown time  . levothyroxine (SYNTHROID, LEVOTHROID) 25 MCG tablet Take by mouth.   Not Taking at Unknown time  . Multiple Vitamin (MULTI-VITAMINS) TABS Take by mouth.   Not Taking at Unknown time  . simvastatin (ZOCOR) 40 MG tablet TAKE 1 TABLET EVERY NIGHT   Not Taking at Unknown time  . tiZANidine (ZANAFLEX) 4 MG tablet Take 4 mg by mouth 3 (three) times daily as needed for muscle spasms.   Not Taking at Unknown time  . valsartan-hydrochlorothiazide (DIOVAN-HCT) 160-12.5 MG tablet TAKE 1 TABLET EVERY DAY   Not Taking at Unknown time     Allergies  Allergen Reactions  . Ace Inhibitors Other (See Comments)     Past Medical  History:  Diagnosis Date  . Gout   . HLD (hyperlipidemia)   . HTN (hypertension)   . Hypogonadism male   . Hypothyroidism   . Microscopic hematuria   . Renal insufficiency     Review of systems:      Physical Exam  General appearance: alert, cooperative and appears stated age Resp: clear to auscultation bilaterally Cardio: regular rate and rhythm, S1, S2 normal, no murmur, click, rub or gallop GI: soft, non-tender; bowel sounds normal; no masses,  no organomegaly     Planned procedures: Proceed with colonoscopy. The patient understands the nature of the planned procedure, indications, risks, alternatives and potential complications including but not limited to bleeding, infection, perforation, damage to internal organs and possible oversedation/side effects from anesthesia. The patient agrees and gives consent to proceed.  Please refer to procedure notes for findings, recommendations and patient disposition/instructions.    Axil Copeman K. Alice Reichert, M.D. Gastroenterology 07/05/2017  1:58 PM

## 2017-07-05 NOTE — Anesthesia Procedure Notes (Signed)
Performed by: COOK-MARTIN, Shriyans Kuenzi Pre-anesthesia Checklist: Patient identified, Emergency Drugs available, Suction available, Patient being monitored and Timeout performed Patient Re-evaluated:Patient Re-evaluated prior to induction Oxygen Delivery Method: Nasal cannula Preoxygenation: Pre-oxygenation with 100% oxygen Induction Type: IV induction Placement Confirmation: CO2 detector and positive ETCO2       

## 2017-07-05 NOTE — Op Note (Signed)
[facility]enter[facility] Patient Name: Bryan Martin Procedure Date: 07/05/2017 1:53 PM MRN: [id number] Account #: [id number] Date of Birth: [birth year] Admit Type: Outpatient Age: 76 Room: [facility] ENDO ROOM 4 Gender: Male Note Status: Finalized Procedure:            Colonoscopy Indications:          Screening for colorectal malignant neoplasm Providers:            Benay Pike. Alice Reichert MD, MD Referring MD:         Hewitt Blade. Sarina Ser, MD (Referring MD) Medicines:            Propofol per Anesthesia Complications:        No immediate complications. Procedure:            Pre-Anesthesia Assessment:                       - ASA Grade Assessment: III - A patient with severe                        systemic disease.                       - After reviewing the risks and benefits, the patient                        was deemed in satisfactory condition to undergo the                        procedure.                       After obtaining informed consent, the colonoscope was                        passed under direct vision. Throughout the procedure,                        the patient's blood pressure, pulse, and oxygen                        saturations were monitored continuously. The Olympus                        CF-H180AL colonoscope ( S#: [id number] ) was introduced                        through the anus and advanced to the the cecum,                        identified by appendiceal orifice and ileocecal valve.                        The colonoscopy was performed without difficulty. The                        patient tolerated the procedure well. The quality of                        the bowel preparation was adequate to identify polyps.  The ileocecal valve, appendiceal orifice, and rectum                        were photographed. Findings:      The entire examined colon appeared normal.      The colon (entire examined portion) appeared normal.  Non-bleeding internal hemorrhoids were found during retroflexion. The       hemorrhoids were mild and Grade I (internal hemorrhoids that do not       prolapse).      The perianal and digital rectal examinations were normal. Pertinent       negatives include normal sphincter tone. Impression:           - The entire examined colon is normal.                       - The entire examined colon is normal.                       - Non-bleeding internal hemorrhoids.                       - No specimens collected. Recommendation:       - Patient has a contact number available for                        emergencies. The signs and symptoms of potential                        delayed complications were discussed with the patient.                        Return to normal activities tomorrow. Written discharge                        instructions were provided to the patient.                       - Resume previous diet.                       - Continue present medications.                       - No repeat colonoscopy due to age.                       - Return to GI office PRN.                       - The findings and recommendations were discussed with                        the patient.                       - The findings and recommendations were discussed with                        the patient's family. Procedure Code(s):    --- Professional ---                       Q6578, Colorectal cancer  screening; colonoscopy on                        individual not meeting criteria for high risk CPT copyright 2016 [employer]. All rights reserved. The codes documented in this report are preliminary and upon coder review may  be revised to meet current compliance requirements. Efrain Sella MD, MD 07/05/2017 2:21:51 PM This report has been signed electronically. Number of Addenda: 0 Note Initiated On: 07/05/2017 1:53 PM Scope Withdrawal Time: 0 hours 7 minutes 7 seconds  Total  Procedure Duration: 0 hours 15 minutes 24 seconds       [facility]

## 2017-07-05 NOTE — Transfer of Care (Signed)
Immediate Anesthesia Transfer of Care Note  Patient: Amado Nash  Procedure(s) Performed: COLONOSCOPY WITH PROPOFOL (N/A )  Patient Location: PACU  Anesthesia Type:General  Level of Consciousness: awake and sedated  Airway & Oxygen Therapy: Patient Spontanous Breathing and Patient connected to nasal cannula oxygen  Post-op Assessment: Report given to RN and Post -op Vital signs reviewed and stable  Post vital signs: Reviewed and stable  Last Vitals:  Vitals:   07/05/17 1224 07/05/17 1423  BP: (!) 149/71   Pulse: 76   Resp: 18   Temp: 36.5 C (!) 36 C  SpO2: 100%     Last Pain:  Vitals:   07/05/17 1423  TempSrc: Tympanic         Complications: No apparent anesthesia complications

## 2017-07-06 ENCOUNTER — Encounter: Payer: Self-pay | Admitting: Internal Medicine

## 2017-07-17 DIAGNOSIS — E790 Hyperuricemia without signs of inflammatory arthritis and tophaceous disease: Secondary | ICD-10-CM | POA: Diagnosis not present

## 2017-07-17 DIAGNOSIS — Z23 Encounter for immunization: Secondary | ICD-10-CM | POA: Diagnosis not present

## 2017-07-17 DIAGNOSIS — N289 Disorder of kidney and ureter, unspecified: Secondary | ICD-10-CM | POA: Diagnosis not present

## 2017-07-17 DIAGNOSIS — I10 Essential (primary) hypertension: Secondary | ICD-10-CM | POA: Diagnosis not present

## 2017-08-21 DIAGNOSIS — L570 Actinic keratosis: Secondary | ICD-10-CM | POA: Diagnosis not present

## 2017-08-21 DIAGNOSIS — X32XXXA Exposure to sunlight, initial encounter: Secondary | ICD-10-CM | POA: Diagnosis not present

## 2017-08-23 DIAGNOSIS — Z5181 Encounter for therapeutic drug level monitoring: Secondary | ICD-10-CM | POA: Diagnosis not present

## 2017-08-23 DIAGNOSIS — E034 Atrophy of thyroid (acquired): Secondary | ICD-10-CM | POA: Diagnosis not present

## 2017-08-23 DIAGNOSIS — I10 Essential (primary) hypertension: Secondary | ICD-10-CM | POA: Diagnosis not present

## 2017-09-14 DIAGNOSIS — Z5181 Encounter for therapeutic drug level monitoring: Secondary | ICD-10-CM | POA: Diagnosis not present

## 2017-09-14 DIAGNOSIS — E034 Atrophy of thyroid (acquired): Secondary | ICD-10-CM | POA: Diagnosis not present

## 2017-09-21 DIAGNOSIS — E034 Atrophy of thyroid (acquired): Secondary | ICD-10-CM | POA: Diagnosis not present

## 2017-09-21 DIAGNOSIS — I10 Essential (primary) hypertension: Secondary | ICD-10-CM | POA: Diagnosis not present

## 2017-09-21 DIAGNOSIS — M1009 Idiopathic gout, multiple sites: Secondary | ICD-10-CM | POA: Diagnosis not present

## 2017-10-16 DIAGNOSIS — I10 Essential (primary) hypertension: Secondary | ICD-10-CM | POA: Diagnosis not present

## 2017-12-25 DIAGNOSIS — N289 Disorder of kidney and ureter, unspecified: Secondary | ICD-10-CM | POA: Diagnosis not present

## 2017-12-25 DIAGNOSIS — Z0000 Encounter for general adult medical examination without abnormal findings: Secondary | ICD-10-CM | POA: Diagnosis not present

## 2017-12-25 DIAGNOSIS — E034 Atrophy of thyroid (acquired): Secondary | ICD-10-CM | POA: Diagnosis not present

## 2017-12-25 DIAGNOSIS — I10 Essential (primary) hypertension: Secondary | ICD-10-CM | POA: Diagnosis not present

## 2017-12-25 DIAGNOSIS — M1009 Idiopathic gout, multiple sites: Secondary | ICD-10-CM | POA: Diagnosis not present

## 2017-12-25 DIAGNOSIS — E7800 Pure hypercholesterolemia, unspecified: Secondary | ICD-10-CM | POA: Diagnosis not present

## 2018-01-11 ENCOUNTER — Ambulatory Visit: Payer: Medicare HMO | Admitting: Urology

## 2018-01-11 ENCOUNTER — Encounter: Payer: Self-pay | Admitting: Urology

## 2018-01-11 VITALS — BP 147/82 | HR 73 | Ht 71.0 in | Wt 181.1 lb

## 2018-01-11 DIAGNOSIS — R3129 Other microscopic hematuria: Secondary | ICD-10-CM | POA: Diagnosis not present

## 2018-01-11 LAB — MICROSCOPIC EXAMINATION
BACTERIA UA: NONE SEEN
EPITHELIAL CELLS (NON RENAL): NONE SEEN /HPF (ref 0–10)
WBC, UA: NONE SEEN /hpf (ref 0–5)

## 2018-01-11 LAB — URINALYSIS, COMPLETE
Bilirubin, UA: NEGATIVE
Glucose, UA: NEGATIVE
Ketones, UA: NEGATIVE
LEUKOCYTES UA: NEGATIVE
Nitrite, UA: NEGATIVE
PH UA: 7 (ref 5.0–7.5)
Protein, UA: NEGATIVE
Specific Gravity, UA: 1.015 (ref 1.005–1.030)
Urobilinogen, Ur: 0.2 mg/dL (ref 0.2–1.0)

## 2018-01-11 NOTE — Progress Notes (Signed)
77 year old white male presents today for ongoing evaluation of his microscopic hematuria.  The patient was last seen in April 2018.  In the interval the patient has had no significant progression of his lower urinary tract symptoms.  He denies any worsening urgency, frequency, or gross hematuria.  He denies any dysuria.  He is done quite well in the interval.  The patient had a complete hematuria evaluation approximately 1 year ago.  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  His CT scan demonstrated bilateral simple cysts with no other clear etiology of his hematuria.  Current Outpatient Medications on File Prior to Visit  Medication Sig Dispense Refill  . allopurinol (ZYLOPRIM) 100 MG tablet Take by mouth.    Marland Kitchen amLODipine (NORVASC) 10 MG tablet     . aspirin EC 81 MG tablet Take by mouth.    . Cyanocobalamin (VITAMIN B 12 PO) Take by mouth.    . felodipine (PLENDIL) 5 MG 24 hr tablet TAKE 1 TABLET ONE TIME DAILY (SUBSTITUTED FOR PLENDIL)    . hydrochlorothiazide (HYDRODIURIL) 25 MG tablet     . levothyroxine (SYNTHROID, LEVOTHROID) 25 MCG tablet Take by mouth.    . saw palmetto 500 MG capsule Take 500 mg by mouth daily.    . simvastatin (ZOCOR) 40 MG tablet TAKE 1 TABLET EVERY NIGHT    . Acetaminophen 500 MG coapsule     . colchicine 0.6 MG tablet 1 tab 4 times a day as needed for gout flare    . Multiple Vitamin (MULTI-VITAMINS) TABS Take by mouth.    Marland Kitchen tiZANidine (ZANAFLEX) 4 MG tablet Take 4 mg by mouth 3 (three) times daily as needed for muscle spasms.     No current facility-administered medications on file prior to visit.    Past Medical History:  Diagnosis Date  . Gout   . HLD (hyperlipidemia)   . HTN (hypertension)   . Hypogonadism male   . Hypothyroidism   . Microscopic hematuria   . Renal insufficiency    Past Surgical History:  Procedure Laterality Date  . CHOLECYSTECTOMY  2002  . COLONOSCOPY   11/27/2006   Dr. Kurtis Bushman @ [place]- Hyperplastic, repeat 10 yrs per [facility], letter mailed   . COLONOSCOPY WITH PROPOFOL N/A 07/05/2017   Procedure: COLONOSCOPY WITH PROPOFOL;  Surgeon: Toledo, Benay Pike, MD;  Location: [facility] ENDOSCOPY;  Service: Endoscopy;  Laterality: N/A;  . FLEXIBLE SIGMOIDOSCOPY      NAD Vitals:   01/11/18 1121  BP: (!) 147/82  Pulse: 73  Weight: 82.1 kg (181 lb 1.6 oz)  Height: 5\' 11"  (1.803 m)    UA: The patient's urinalysis demonstrated no evidence of microscopic hematuria.   Impression: The patient's microscopic hematuria has been completely evaluated with no additional recurrences or ongoing symptoms.  Recommendation: The patient needs no additional follow-up for his microscopic hematuria at this time.  I recommend that he continue follow-up with his primary care doctor and see [place] on an as-needed basis.

## 2018-02-05 DIAGNOSIS — I10 Essential (primary) hypertension: Secondary | ICD-10-CM | POA: Diagnosis not present

## 2018-02-05 DIAGNOSIS — R600 Localized edema: Secondary | ICD-10-CM | POA: Diagnosis not present

## 2018-02-26 DIAGNOSIS — I10 Essential (primary) hypertension: Secondary | ICD-10-CM | POA: Diagnosis not present

## 2018-03-20 DIAGNOSIS — L218 Other seborrheic dermatitis: Secondary | ICD-10-CM | POA: Diagnosis not present

## 2018-03-26 DIAGNOSIS — H43813 Vitreous degeneration, bilateral: Secondary | ICD-10-CM | POA: Diagnosis not present

## 2018-05-10 IMAGING — US US RETROPERITONEAL COMPLETE
1 series · 14 of 25 positions shown · non-contrast
Comparison: None.

CLINICAL DATA: Abdominal bruit.

EXAM:
ULTRASOUND RETROPERITONEAL COMPLETE
TECHNIQUE: Ultrasound examination of the abdominal aorta was performed to
evaluate for abdominal aortic aneurysm. The common iliac arteries,
IVC, and kidneys were also evaluated.

[Series 1: us retroperitoneal complete · 0.26mm/px · 14 of 106 slices shown]
[im 1/106]
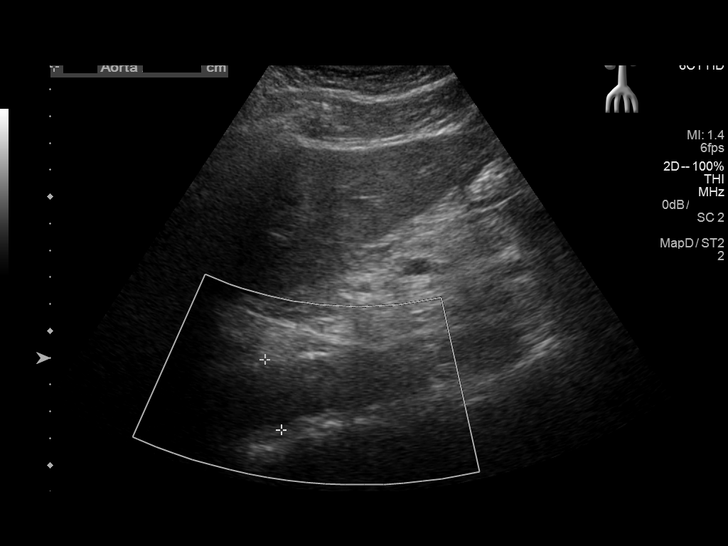
[im 9/106]
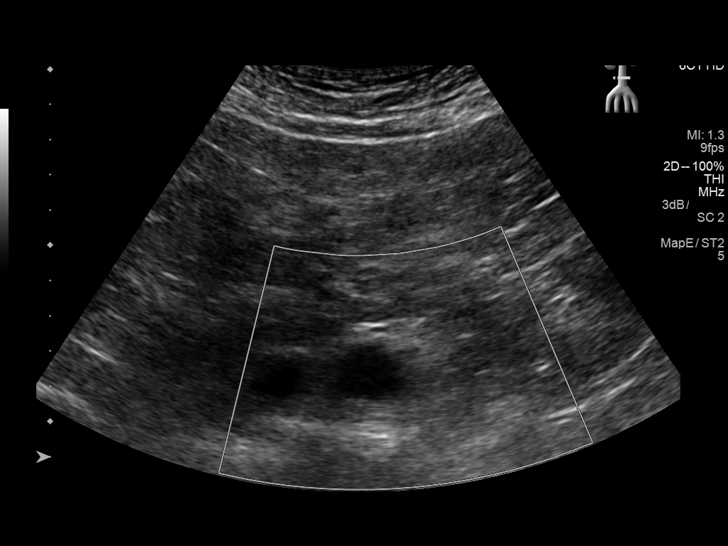
[im 18/106]
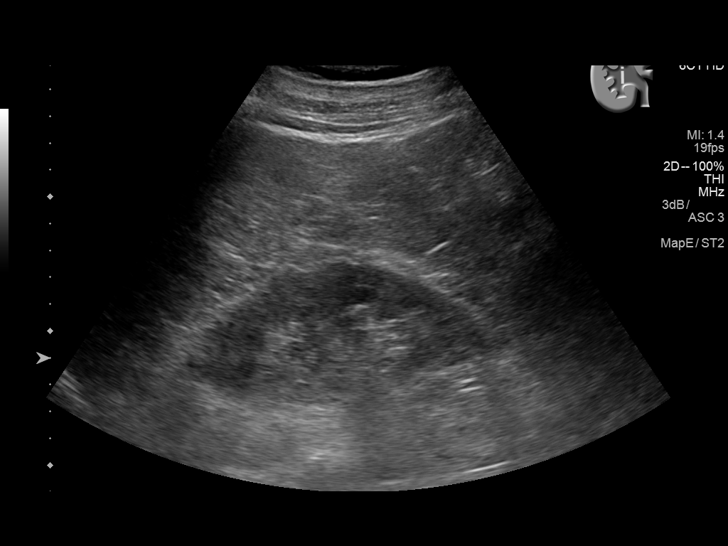
[im 27/106]
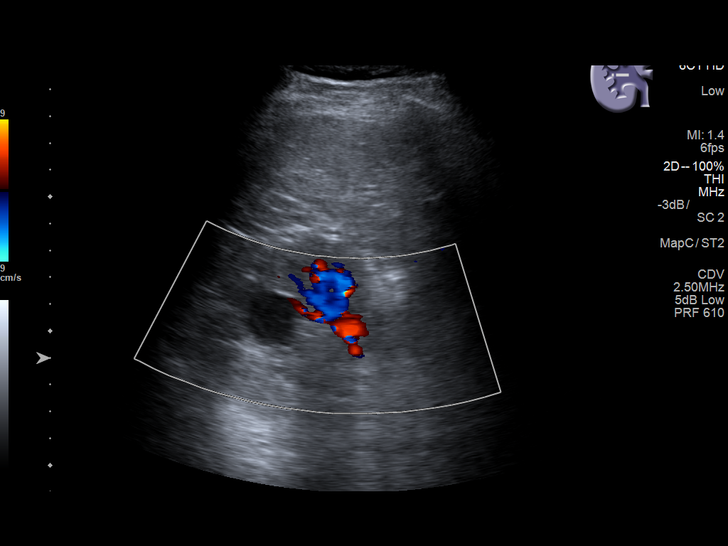
[im 36/106]
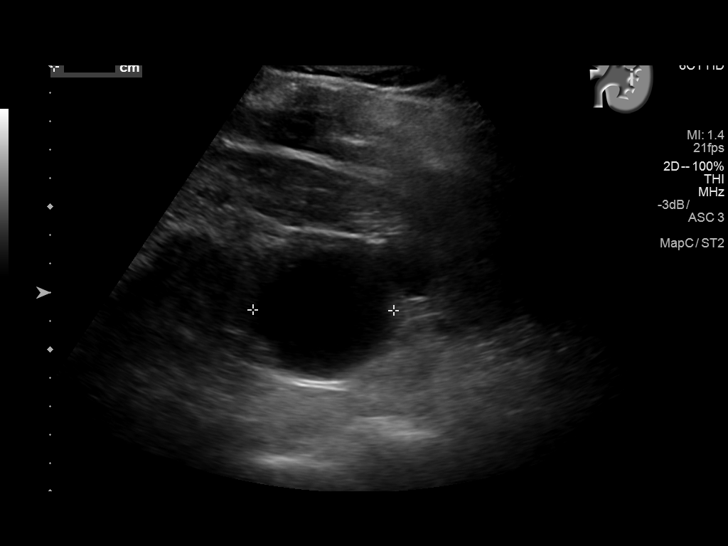
[im 40/106]
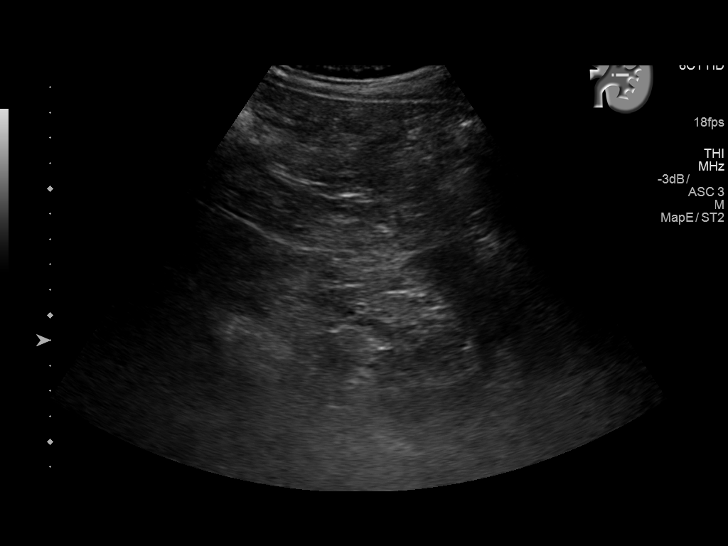
[im 49/106]
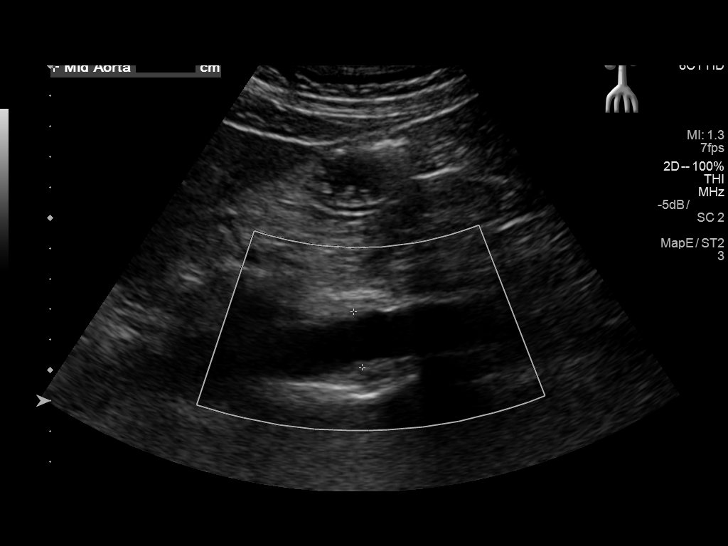
[im 57/106]
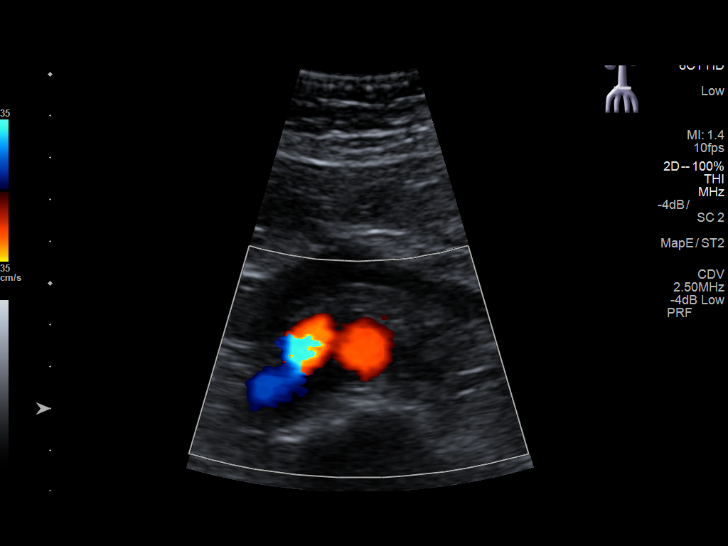
[im 66/106]
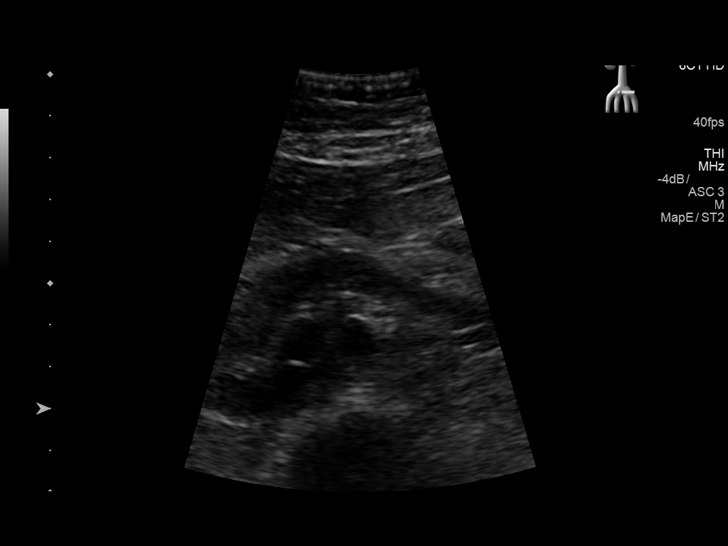
[im 71/106]
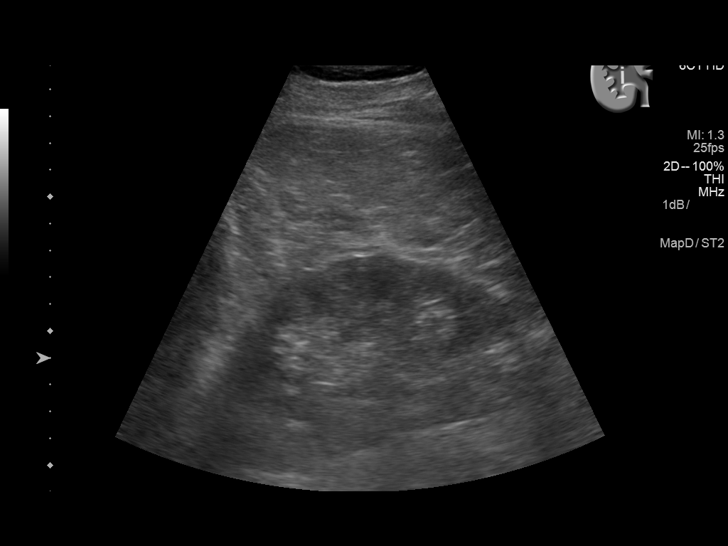
[im 79/106]
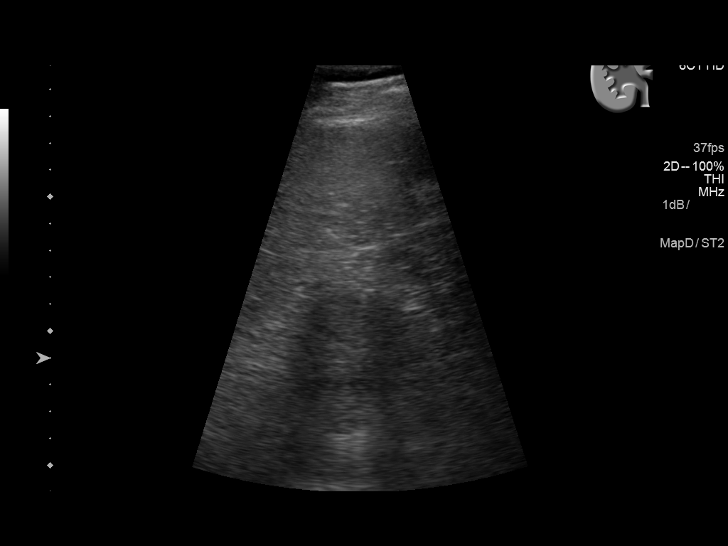
[im 88/106]
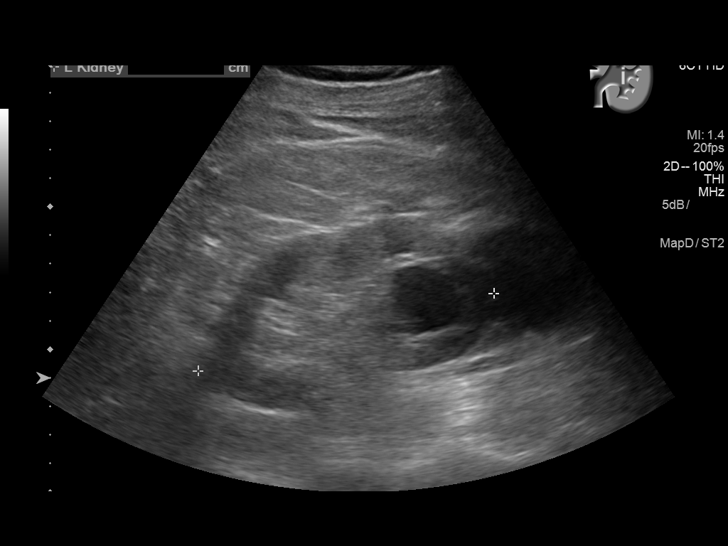
[im 97/106]
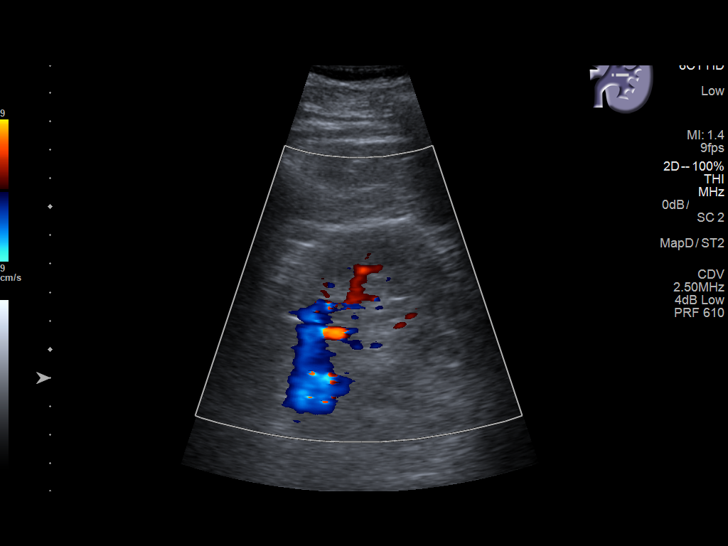
[im 106/106]
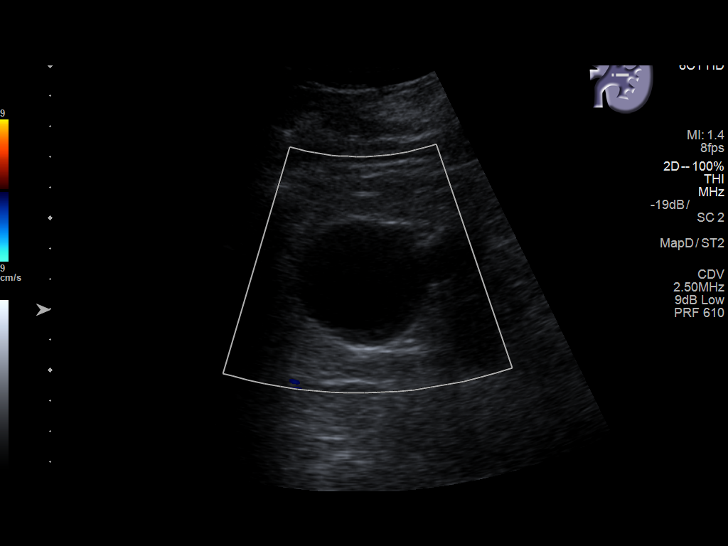

[14 of 25 positions shown; findings below may reference images not displayed]

FINDINGS: Abdominal Aorta

No aneurysm identified.

Maximum AP

Diameter:  2.8 cm.

Maximum TRV

Diameter: 2.7 cm.

Right Common Iliac Artery

No aneurysm identified.

Left Common Iliac Artery

No aneurysm identified.

IVC

No abnormality visualized.

Right Kidney

Length: 10 cm. 2.5 cm simple cyst is noted. Echogenicity within
normal limits. No mass or hydronephrosis visualized.

Left Kidney

Length: 10.7 cm. Multiple cysts are noted, with the largest
measuring 5 cm. Echogenicity within normal limits. No mass or
hydronephrosis visualized.
IMPRESSION: Bilateral simple renal cysts. No evidence of abdominal aortic
aneurysm. No other abnormality seen in the retroperitoneal region.

## 2018-05-29 DIAGNOSIS — D2262 Melanocytic nevi of left upper limb, including shoulder: Secondary | ICD-10-CM | POA: Diagnosis not present

## 2018-05-29 DIAGNOSIS — L218 Other seborrheic dermatitis: Secondary | ICD-10-CM | POA: Diagnosis not present

## 2018-05-29 DIAGNOSIS — D225 Melanocytic nevi of trunk: Secondary | ICD-10-CM | POA: Diagnosis not present

## 2018-05-29 DIAGNOSIS — D2271 Melanocytic nevi of right lower limb, including hip: Secondary | ICD-10-CM | POA: Diagnosis not present

## 2018-05-29 DIAGNOSIS — D2272 Melanocytic nevi of left lower limb, including hip: Secondary | ICD-10-CM | POA: Diagnosis not present

## 2018-05-29 DIAGNOSIS — Z85828 Personal history of other malignant neoplasm of skin: Secondary | ICD-10-CM | POA: Diagnosis not present

## 2018-05-29 DIAGNOSIS — L821 Other seborrheic keratosis: Secondary | ICD-10-CM | POA: Diagnosis not present

## 2018-05-29 DIAGNOSIS — Z08 Encounter for follow-up examination after completed treatment for malignant neoplasm: Secondary | ICD-10-CM | POA: Diagnosis not present

## 2018-05-29 DIAGNOSIS — D2261 Melanocytic nevi of right upper limb, including shoulder: Secondary | ICD-10-CM | POA: Diagnosis not present

## 2018-06-19 DIAGNOSIS — I10 Essential (primary) hypertension: Secondary | ICD-10-CM | POA: Diagnosis not present

## 2018-06-19 DIAGNOSIS — E7800 Pure hypercholesterolemia, unspecified: Secondary | ICD-10-CM | POA: Diagnosis not present

## 2018-06-19 DIAGNOSIS — E034 Atrophy of thyroid (acquired): Secondary | ICD-10-CM | POA: Diagnosis not present

## 2018-06-27 DIAGNOSIS — Z23 Encounter for immunization: Secondary | ICD-10-CM | POA: Diagnosis not present

## 2018-06-27 DIAGNOSIS — N289 Disorder of kidney and ureter, unspecified: Secondary | ICD-10-CM | POA: Diagnosis not present

## 2018-06-27 DIAGNOSIS — E034 Atrophy of thyroid (acquired): Secondary | ICD-10-CM | POA: Diagnosis not present

## 2018-06-27 DIAGNOSIS — R972 Elevated prostate specific antigen [PSA]: Secondary | ICD-10-CM | POA: Diagnosis not present

## 2018-06-27 DIAGNOSIS — I10 Essential (primary) hypertension: Secondary | ICD-10-CM | POA: Diagnosis not present

## 2018-06-27 DIAGNOSIS — E7800 Pure hypercholesterolemia, unspecified: Secondary | ICD-10-CM | POA: Diagnosis not present

## 2018-06-27 DIAGNOSIS — Z0001 Encounter for general adult medical examination with abnormal findings: Secondary | ICD-10-CM | POA: Diagnosis not present

## 2019-03-14 DIAGNOSIS — E034 Atrophy of thyroid (acquired): Secondary | ICD-10-CM | POA: Diagnosis not present

## 2019-03-14 DIAGNOSIS — I10 Essential (primary) hypertension: Secondary | ICD-10-CM | POA: Diagnosis not present

## 2019-03-21 DIAGNOSIS — E034 Atrophy of thyroid (acquired): Secondary | ICD-10-CM | POA: Diagnosis not present

## 2019-03-21 DIAGNOSIS — I10 Essential (primary) hypertension: Secondary | ICD-10-CM | POA: Diagnosis not present

## 2019-03-21 DIAGNOSIS — Z0000 Encounter for general adult medical examination without abnormal findings: Secondary | ICD-10-CM | POA: Diagnosis not present

## 2019-03-21 DIAGNOSIS — E7800 Pure hypercholesterolemia, unspecified: Secondary | ICD-10-CM | POA: Diagnosis not present

## 2019-03-21 DIAGNOSIS — N289 Disorder of kidney and ureter, unspecified: Secondary | ICD-10-CM | POA: Diagnosis not present

## 2019-05-28 DIAGNOSIS — Z08 Encounter for follow-up examination after completed treatment for malignant neoplasm: Secondary | ICD-10-CM | POA: Diagnosis not present

## 2019-05-28 DIAGNOSIS — Z85828 Personal history of other malignant neoplasm of skin: Secondary | ICD-10-CM | POA: Diagnosis not present

## 2019-05-28 DIAGNOSIS — D2261 Melanocytic nevi of right upper limb, including shoulder: Secondary | ICD-10-CM | POA: Diagnosis not present

## 2019-05-28 DIAGNOSIS — D485 Neoplasm of uncertain behavior of skin: Secondary | ICD-10-CM | POA: Diagnosis not present

## 2019-05-28 DIAGNOSIS — L820 Inflamed seborrheic keratosis: Secondary | ICD-10-CM | POA: Diagnosis not present

## 2019-05-28 DIAGNOSIS — D2262 Melanocytic nevi of left upper limb, including shoulder: Secondary | ICD-10-CM | POA: Diagnosis not present

## 2019-05-28 DIAGNOSIS — D225 Melanocytic nevi of trunk: Secondary | ICD-10-CM | POA: Diagnosis not present

## 2019-05-28 DIAGNOSIS — L218 Other seborrheic dermatitis: Secondary | ICD-10-CM | POA: Diagnosis not present

## 2019-05-28 DIAGNOSIS — D2271 Melanocytic nevi of right lower limb, including hip: Secondary | ICD-10-CM | POA: Diagnosis not present

## 2019-05-28 DIAGNOSIS — D2272 Melanocytic nevi of left lower limb, including hip: Secondary | ICD-10-CM | POA: Diagnosis not present

## 2019-07-24 DIAGNOSIS — H0012 Chalazion right lower eyelid: Secondary | ICD-10-CM | POA: Diagnosis not present

## 2019-09-04 DIAGNOSIS — I10 Essential (primary) hypertension: Secondary | ICD-10-CM | POA: Diagnosis not present

## 2019-09-04 DIAGNOSIS — E034 Atrophy of thyroid (acquired): Secondary | ICD-10-CM | POA: Diagnosis not present

## 2019-09-04 DIAGNOSIS — E7800 Pure hypercholesterolemia, unspecified: Secondary | ICD-10-CM | POA: Diagnosis not present

## 2019-09-11 DIAGNOSIS — Z0001 Encounter for general adult medical examination with abnormal findings: Secondary | ICD-10-CM | POA: Diagnosis not present

## 2019-09-11 DIAGNOSIS — E034 Atrophy of thyroid (acquired): Secondary | ICD-10-CM | POA: Diagnosis not present

## 2019-09-11 DIAGNOSIS — N289 Disorder of kidney and ureter, unspecified: Secondary | ICD-10-CM | POA: Diagnosis not present

## 2019-09-11 DIAGNOSIS — Z87891 Personal history of nicotine dependence: Secondary | ICD-10-CM | POA: Diagnosis not present

## 2019-09-11 DIAGNOSIS — E038 Other specified hypothyroidism: Secondary | ICD-10-CM | POA: Diagnosis not present

## 2019-09-11 DIAGNOSIS — I10 Essential (primary) hypertension: Secondary | ICD-10-CM | POA: Diagnosis not present

## 2019-09-11 DIAGNOSIS — Z79899 Other long term (current) drug therapy: Secondary | ICD-10-CM | POA: Diagnosis not present

## 2020-03-04 DIAGNOSIS — E034 Atrophy of thyroid (acquired): Secondary | ICD-10-CM | POA: Diagnosis not present

## 2020-03-11 DIAGNOSIS — E7800 Pure hypercholesterolemia, unspecified: Secondary | ICD-10-CM | POA: Diagnosis not present

## 2020-03-11 DIAGNOSIS — E038 Other specified hypothyroidism: Secondary | ICD-10-CM | POA: Diagnosis not present

## 2020-03-11 DIAGNOSIS — N289 Disorder of kidney and ureter, unspecified: Secondary | ICD-10-CM | POA: Diagnosis not present

## 2020-03-11 DIAGNOSIS — Z0000 Encounter for general adult medical examination without abnormal findings: Secondary | ICD-10-CM | POA: Diagnosis not present

## 2020-03-11 DIAGNOSIS — Z79899 Other long term (current) drug therapy: Secondary | ICD-10-CM | POA: Diagnosis not present

## 2020-03-11 DIAGNOSIS — Z87891 Personal history of nicotine dependence: Secondary | ICD-10-CM | POA: Diagnosis not present

## 2020-03-11 DIAGNOSIS — E034 Atrophy of thyroid (acquired): Secondary | ICD-10-CM | POA: Diagnosis not present

## 2020-03-11 DIAGNOSIS — I10 Essential (primary) hypertension: Secondary | ICD-10-CM | POA: Diagnosis not present

## 2020-04-22 DIAGNOSIS — S0501XA Injury of conjunctiva and corneal abrasion without foreign body, right eye, initial encounter: Secondary | ICD-10-CM | POA: Diagnosis not present

## 2020-04-24 DIAGNOSIS — S0501XD Injury of conjunctiva and corneal abrasion without foreign body, right eye, subsequent encounter: Secondary | ICD-10-CM | POA: Diagnosis not present

## 2020-05-28 DIAGNOSIS — Z85828 Personal history of other malignant neoplasm of skin: Secondary | ICD-10-CM | POA: Diagnosis not present

## 2020-05-28 DIAGNOSIS — L570 Actinic keratosis: Secondary | ICD-10-CM | POA: Diagnosis not present

## 2020-05-28 DIAGNOSIS — D2272 Melanocytic nevi of left lower limb, including hip: Secondary | ICD-10-CM | POA: Diagnosis not present

## 2020-05-28 DIAGNOSIS — D225 Melanocytic nevi of trunk: Secondary | ICD-10-CM | POA: Diagnosis not present

## 2020-05-28 DIAGNOSIS — D2262 Melanocytic nevi of left upper limb, including shoulder: Secondary | ICD-10-CM | POA: Diagnosis not present

## 2020-05-28 DIAGNOSIS — X32XXXA Exposure to sunlight, initial encounter: Secondary | ICD-10-CM | POA: Diagnosis not present

## 2020-05-28 DIAGNOSIS — D2261 Melanocytic nevi of right upper limb, including shoulder: Secondary | ICD-10-CM | POA: Diagnosis not present

## 2020-07-22 DIAGNOSIS — M79604 Pain in right leg: Secondary | ICD-10-CM | POA: Diagnosis not present

## 2020-07-22 DIAGNOSIS — Z23 Encounter for immunization: Secondary | ICD-10-CM | POA: Diagnosis not present

## 2020-07-24 DIAGNOSIS — H2511 Age-related nuclear cataract, right eye: Secondary | ICD-10-CM | POA: Diagnosis not present

## 2020-07-30 DIAGNOSIS — J40 Bronchitis, not specified as acute or chronic: Secondary | ICD-10-CM | POA: Diagnosis not present

## 2020-07-30 DIAGNOSIS — Z03818 Encounter for observation for suspected exposure to other biological agents ruled out: Secondary | ICD-10-CM | POA: Diagnosis not present

## 2020-07-30 DIAGNOSIS — R6889 Other general symptoms and signs: Secondary | ICD-10-CM | POA: Diagnosis not present

## 2020-09-04 DIAGNOSIS — E034 Atrophy of thyroid (acquired): Secondary | ICD-10-CM | POA: Diagnosis not present

## 2020-09-04 DIAGNOSIS — I10 Essential (primary) hypertension: Secondary | ICD-10-CM | POA: Diagnosis not present

## 2020-09-04 DIAGNOSIS — E7800 Pure hypercholesterolemia, unspecified: Secondary | ICD-10-CM | POA: Diagnosis not present

## 2020-09-11 DIAGNOSIS — I10 Essential (primary) hypertension: Secondary | ICD-10-CM | POA: Diagnosis not present

## 2020-09-11 DIAGNOSIS — E034 Atrophy of thyroid (acquired): Secondary | ICD-10-CM | POA: Diagnosis not present

## 2020-09-11 DIAGNOSIS — E7800 Pure hypercholesterolemia, unspecified: Secondary | ICD-10-CM | POA: Diagnosis not present

## 2020-09-11 DIAGNOSIS — N289 Disorder of kidney and ureter, unspecified: Secondary | ICD-10-CM | POA: Diagnosis not present

## 2020-09-11 DIAGNOSIS — Z0001 Encounter for general adult medical examination with abnormal findings: Secondary | ICD-10-CM | POA: Diagnosis not present

## 2020-10-07 DIAGNOSIS — J029 Acute pharyngitis, unspecified: Secondary | ICD-10-CM | POA: Diagnosis not present

## 2021-03-05 DIAGNOSIS — E034 Atrophy of thyroid (acquired): Secondary | ICD-10-CM | POA: Diagnosis not present

## 2021-03-05 DIAGNOSIS — I10 Essential (primary) hypertension: Secondary | ICD-10-CM | POA: Diagnosis not present

## 2021-03-12 DIAGNOSIS — Z0000 Encounter for general adult medical examination without abnormal findings: Secondary | ICD-10-CM | POA: Diagnosis not present

## 2021-03-12 DIAGNOSIS — N289 Disorder of kidney and ureter, unspecified: Secondary | ICD-10-CM | POA: Diagnosis not present

## 2021-03-12 DIAGNOSIS — I10 Essential (primary) hypertension: Secondary | ICD-10-CM | POA: Diagnosis not present

## 2021-03-12 DIAGNOSIS — E034 Atrophy of thyroid (acquired): Secondary | ICD-10-CM | POA: Diagnosis not present

## 2021-03-12 DIAGNOSIS — E7800 Pure hypercholesterolemia, unspecified: Secondary | ICD-10-CM | POA: Diagnosis not present

## 2021-04-18 DIAGNOSIS — M79651 Pain in right thigh: Secondary | ICD-10-CM | POA: Diagnosis not present

## 2021-04-18 DIAGNOSIS — M25551 Pain in right hip: Secondary | ICD-10-CM | POA: Diagnosis not present

## 2021-04-22 DIAGNOSIS — N1832 Chronic kidney disease, stage 3b: Secondary | ICD-10-CM | POA: Diagnosis not present

## 2021-04-22 DIAGNOSIS — S76111A Strain of right quadriceps muscle, fascia and tendon, initial encounter: Secondary | ICD-10-CM | POA: Diagnosis not present

## 2021-04-28 DIAGNOSIS — S76111A Strain of right quadriceps muscle, fascia and tendon, initial encounter: Secondary | ICD-10-CM | POA: Diagnosis not present

## 2021-05-21 DIAGNOSIS — J019 Acute sinusitis, unspecified: Secondary | ICD-10-CM | POA: Diagnosis not present

## 2021-05-21 DIAGNOSIS — B9689 Other specified bacterial agents as the cause of diseases classified elsewhere: Secondary | ICD-10-CM | POA: Diagnosis not present

## 2021-06-01 DIAGNOSIS — D2261 Melanocytic nevi of right upper limb, including shoulder: Secondary | ICD-10-CM | POA: Diagnosis not present

## 2021-06-01 DIAGNOSIS — X32XXXA Exposure to sunlight, initial encounter: Secondary | ICD-10-CM | POA: Diagnosis not present

## 2021-06-01 DIAGNOSIS — L570 Actinic keratosis: Secondary | ICD-10-CM | POA: Diagnosis not present

## 2021-06-01 DIAGNOSIS — D485 Neoplasm of uncertain behavior of skin: Secondary | ICD-10-CM | POA: Diagnosis not present

## 2021-06-01 DIAGNOSIS — D0462 Carcinoma in situ of skin of left upper limb, including shoulder: Secondary | ICD-10-CM | POA: Diagnosis not present

## 2021-06-01 DIAGNOSIS — D2271 Melanocytic nevi of right lower limb, including hip: Secondary | ICD-10-CM | POA: Diagnosis not present

## 2021-06-01 DIAGNOSIS — D2262 Melanocytic nevi of left upper limb, including shoulder: Secondary | ICD-10-CM | POA: Diagnosis not present

## 2021-06-01 DIAGNOSIS — Z85828 Personal history of other malignant neoplasm of skin: Secondary | ICD-10-CM | POA: Diagnosis not present

## 2021-07-15 DIAGNOSIS — D0462 Carcinoma in situ of skin of left upper limb, including shoulder: Secondary | ICD-10-CM | POA: Diagnosis not present

## 2021-07-28 DIAGNOSIS — H35372 Puckering of macula, left eye: Secondary | ICD-10-CM | POA: Diagnosis not present

## 2021-09-07 DIAGNOSIS — I10 Essential (primary) hypertension: Secondary | ICD-10-CM | POA: Diagnosis not present

## 2021-09-14 DIAGNOSIS — E034 Atrophy of thyroid (acquired): Secondary | ICD-10-CM | POA: Diagnosis not present

## 2021-09-14 DIAGNOSIS — Z0001 Encounter for general adult medical examination with abnormal findings: Secondary | ICD-10-CM | POA: Diagnosis not present

## 2021-09-14 DIAGNOSIS — E7800 Pure hypercholesterolemia, unspecified: Secondary | ICD-10-CM | POA: Diagnosis not present

## 2021-09-14 DIAGNOSIS — I129 Hypertensive chronic kidney disease with stage 1 through stage 4 chronic kidney disease, or unspecified chronic kidney disease: Secondary | ICD-10-CM | POA: Diagnosis not present

## 2021-09-14 DIAGNOSIS — Z87891 Personal history of nicotine dependence: Secondary | ICD-10-CM | POA: Diagnosis not present

## 2021-09-14 DIAGNOSIS — N183 Chronic kidney disease, stage 3 unspecified: Secondary | ICD-10-CM | POA: Diagnosis not present

## 2021-09-16 DIAGNOSIS — J069 Acute upper respiratory infection, unspecified: Secondary | ICD-10-CM | POA: Diagnosis not present

## 2021-10-06 DIAGNOSIS — Z03818 Encounter for observation for suspected exposure to other biological agents ruled out: Secondary | ICD-10-CM | POA: Diagnosis not present

## 2021-10-06 DIAGNOSIS — R6889 Other general symptoms and signs: Secondary | ICD-10-CM | POA: Diagnosis not present

## 2021-10-06 DIAGNOSIS — R300 Dysuria: Secondary | ICD-10-CM | POA: Diagnosis not present

## 2021-10-26 DIAGNOSIS — L304 Erythema intertrigo: Secondary | ICD-10-CM | POA: Diagnosis not present

## 2021-10-26 DIAGNOSIS — D485 Neoplasm of uncertain behavior of skin: Secondary | ICD-10-CM | POA: Diagnosis not present

## 2021-10-26 DIAGNOSIS — L820 Inflamed seborrheic keratosis: Secondary | ICD-10-CM | POA: Diagnosis not present

## 2021-10-26 DIAGNOSIS — L538 Other specified erythematous conditions: Secondary | ICD-10-CM | POA: Diagnosis not present

## 2022-01-19 DIAGNOSIS — L538 Other specified erythematous conditions: Secondary | ICD-10-CM | POA: Diagnosis not present

## 2022-01-19 DIAGNOSIS — L570 Actinic keratosis: Secondary | ICD-10-CM | POA: Diagnosis not present

## 2022-01-19 DIAGNOSIS — D225 Melanocytic nevi of trunk: Secondary | ICD-10-CM | POA: Diagnosis not present

## 2022-01-19 DIAGNOSIS — Z85828 Personal history of other malignant neoplasm of skin: Secondary | ICD-10-CM | POA: Diagnosis not present

## 2022-01-19 DIAGNOSIS — L820 Inflamed seborrheic keratosis: Secondary | ICD-10-CM | POA: Diagnosis not present

## 2022-01-19 DIAGNOSIS — D2262 Melanocytic nevi of left upper limb, including shoulder: Secondary | ICD-10-CM | POA: Diagnosis not present

## 2022-01-19 DIAGNOSIS — D2261 Melanocytic nevi of right upper limb, including shoulder: Secondary | ICD-10-CM | POA: Diagnosis not present

## 2022-03-08 DIAGNOSIS — R300 Dysuria: Secondary | ICD-10-CM | POA: Diagnosis not present

## 2022-03-08 DIAGNOSIS — E034 Atrophy of thyroid (acquired): Secondary | ICD-10-CM | POA: Diagnosis not present

## 2022-03-15 DIAGNOSIS — E034 Atrophy of thyroid (acquired): Secondary | ICD-10-CM | POA: Diagnosis not present

## 2022-03-15 DIAGNOSIS — E7800 Pure hypercholesterolemia, unspecified: Secondary | ICD-10-CM | POA: Diagnosis not present

## 2022-03-15 DIAGNOSIS — N289 Disorder of kidney and ureter, unspecified: Secondary | ICD-10-CM | POA: Diagnosis not present

## 2022-03-15 DIAGNOSIS — I10 Essential (primary) hypertension: Secondary | ICD-10-CM | POA: Diagnosis not present

## 2022-08-04 DIAGNOSIS — H353131 Nonexudative age-related macular degeneration, bilateral, early dry stage: Secondary | ICD-10-CM | POA: Diagnosis not present

## 2022-09-14 DIAGNOSIS — E034 Atrophy of thyroid (acquired): Secondary | ICD-10-CM | POA: Diagnosis not present

## 2022-09-21 DIAGNOSIS — Z0001 Encounter for general adult medical examination with abnormal findings: Secondary | ICD-10-CM | POA: Diagnosis not present

## 2022-09-21 DIAGNOSIS — E034 Atrophy of thyroid (acquired): Secondary | ICD-10-CM | POA: Diagnosis not present

## 2022-09-21 DIAGNOSIS — E7800 Pure hypercholesterolemia, unspecified: Secondary | ICD-10-CM | POA: Diagnosis not present

## 2022-09-21 DIAGNOSIS — N183 Chronic kidney disease, stage 3 unspecified: Secondary | ICD-10-CM | POA: Diagnosis not present

## 2022-09-21 DIAGNOSIS — E790 Hyperuricemia without signs of inflammatory arthritis and tophaceous disease: Secondary | ICD-10-CM | POA: Diagnosis not present

## 2022-09-21 DIAGNOSIS — I129 Hypertensive chronic kidney disease with stage 1 through stage 4 chronic kidney disease, or unspecified chronic kidney disease: Secondary | ICD-10-CM | POA: Diagnosis not present

## 2022-09-21 DIAGNOSIS — Z0000 Encounter for general adult medical examination without abnormal findings: Secondary | ICD-10-CM | POA: Diagnosis not present

## 2022-09-21 DIAGNOSIS — Z23 Encounter for immunization: Secondary | ICD-10-CM | POA: Diagnosis not present

## 2022-09-21 DIAGNOSIS — Z87891 Personal history of nicotine dependence: Secondary | ICD-10-CM | POA: Diagnosis not present

## 2022-09-27 DIAGNOSIS — X32XXXA Exposure to sunlight, initial encounter: Secondary | ICD-10-CM | POA: Diagnosis not present

## 2022-09-27 DIAGNOSIS — D2271 Melanocytic nevi of right lower limb, including hip: Secondary | ICD-10-CM | POA: Diagnosis not present

## 2022-09-27 DIAGNOSIS — B078 Other viral warts: Secondary | ICD-10-CM | POA: Diagnosis not present

## 2022-09-27 DIAGNOSIS — C4441 Basal cell carcinoma of skin of scalp and neck: Secondary | ICD-10-CM | POA: Diagnosis not present

## 2022-09-27 DIAGNOSIS — L570 Actinic keratosis: Secondary | ICD-10-CM | POA: Diagnosis not present

## 2022-09-27 DIAGNOSIS — D2261 Melanocytic nevi of right upper limb, including shoulder: Secondary | ICD-10-CM | POA: Diagnosis not present

## 2022-09-27 DIAGNOSIS — D485 Neoplasm of uncertain behavior of skin: Secondary | ICD-10-CM | POA: Diagnosis not present

## 2022-09-27 DIAGNOSIS — D225 Melanocytic nevi of trunk: Secondary | ICD-10-CM | POA: Diagnosis not present

## 2022-09-27 DIAGNOSIS — D2262 Melanocytic nevi of left upper limb, including shoulder: Secondary | ICD-10-CM | POA: Diagnosis not present

## 2022-09-27 DIAGNOSIS — Z85828 Personal history of other malignant neoplasm of skin: Secondary | ICD-10-CM | POA: Diagnosis not present

## 2022-10-13 DIAGNOSIS — C4441 Basal cell carcinoma of skin of scalp and neck: Secondary | ICD-10-CM | POA: Diagnosis not present

## 2022-12-27 DIAGNOSIS — M545 Low back pain, unspecified: Secondary | ICD-10-CM | POA: Diagnosis not present

## 2022-12-27 DIAGNOSIS — M79604 Pain in right leg: Secondary | ICD-10-CM | POA: Diagnosis not present

## 2022-12-27 DIAGNOSIS — M4316 Spondylolisthesis, lumbar region: Secondary | ICD-10-CM | POA: Diagnosis not present

## 2022-12-27 DIAGNOSIS — M5136 Other intervertebral disc degeneration, lumbar region: Secondary | ICD-10-CM | POA: Diagnosis not present

## 2023-01-11 DIAGNOSIS — M79604 Pain in right leg: Secondary | ICD-10-CM | POA: Diagnosis not present

## 2023-03-17 DIAGNOSIS — E034 Atrophy of thyroid (acquired): Secondary | ICD-10-CM | POA: Diagnosis not present

## 2023-03-24 DIAGNOSIS — E034 Atrophy of thyroid (acquired): Secondary | ICD-10-CM | POA: Diagnosis not present

## 2023-03-24 DIAGNOSIS — E7800 Pure hypercholesterolemia, unspecified: Secondary | ICD-10-CM | POA: Diagnosis not present

## 2023-03-24 DIAGNOSIS — Z87891 Personal history of nicotine dependence: Secondary | ICD-10-CM | POA: Diagnosis not present

## 2023-03-24 DIAGNOSIS — N1832 Chronic kidney disease, stage 3b: Secondary | ICD-10-CM | POA: Diagnosis not present

## 2023-03-24 DIAGNOSIS — I129 Hypertensive chronic kidney disease with stage 1 through stage 4 chronic kidney disease, or unspecified chronic kidney disease: Secondary | ICD-10-CM | POA: Diagnosis not present

## 2023-03-24 DIAGNOSIS — Z0000 Encounter for general adult medical examination without abnormal findings: Secondary | ICD-10-CM | POA: Diagnosis not present

## 2023-04-24 DIAGNOSIS — D2271 Melanocytic nevi of right lower limb, including hip: Secondary | ICD-10-CM | POA: Diagnosis not present

## 2023-04-24 DIAGNOSIS — D2262 Melanocytic nevi of left upper limb, including shoulder: Secondary | ICD-10-CM | POA: Diagnosis not present

## 2023-04-24 DIAGNOSIS — Z85828 Personal history of other malignant neoplasm of skin: Secondary | ICD-10-CM | POA: Diagnosis not present

## 2023-04-24 DIAGNOSIS — X32XXXA Exposure to sunlight, initial encounter: Secondary | ICD-10-CM | POA: Diagnosis not present

## 2023-04-24 DIAGNOSIS — L570 Actinic keratosis: Secondary | ICD-10-CM | POA: Diagnosis not present

## 2023-04-24 DIAGNOSIS — D2261 Melanocytic nevi of right upper limb, including shoulder: Secondary | ICD-10-CM | POA: Diagnosis not present

## 2023-08-31 DIAGNOSIS — H2513 Age-related nuclear cataract, bilateral: Secondary | ICD-10-CM | POA: Diagnosis not present

## 2023-08-31 DIAGNOSIS — Z0100 Encounter for examination of eyes and vision without abnormal findings: Secondary | ICD-10-CM | POA: Diagnosis not present

## 2023-08-31 DIAGNOSIS — H43813 Vitreous degeneration, bilateral: Secondary | ICD-10-CM | POA: Diagnosis not present

## 2023-09-14 DIAGNOSIS — Z0100 Encounter for examination of eyes and vision without abnormal findings: Secondary | ICD-10-CM | POA: Diagnosis not present

## 2023-09-28 DIAGNOSIS — E034 Atrophy of thyroid (acquired): Secondary | ICD-10-CM | POA: Diagnosis not present

## 2023-11-10 DIAGNOSIS — I10 Essential (primary) hypertension: Secondary | ICD-10-CM | POA: Diagnosis not present

## 2023-11-10 DIAGNOSIS — Z0000 Encounter for general adult medical examination without abnormal findings: Secondary | ICD-10-CM | POA: Diagnosis not present

## 2023-11-10 DIAGNOSIS — E034 Atrophy of thyroid (acquired): Secondary | ICD-10-CM | POA: Diagnosis not present

## 2023-11-10 DIAGNOSIS — E7800 Pure hypercholesterolemia, unspecified: Secondary | ICD-10-CM | POA: Diagnosis not present

## 2023-11-10 DIAGNOSIS — Z0001 Encounter for general adult medical examination with abnormal findings: Secondary | ICD-10-CM | POA: Diagnosis not present

## 2023-11-10 DIAGNOSIS — N1832 Chronic kidney disease, stage 3b: Secondary | ICD-10-CM | POA: Diagnosis not present

## 2024-01-01 DIAGNOSIS — D2262 Melanocytic nevi of left upper limb, including shoulder: Secondary | ICD-10-CM | POA: Diagnosis not present

## 2024-01-01 DIAGNOSIS — Z85828 Personal history of other malignant neoplasm of skin: Secondary | ICD-10-CM | POA: Diagnosis not present

## 2024-01-01 DIAGNOSIS — D2261 Melanocytic nevi of right upper limb, including shoulder: Secondary | ICD-10-CM | POA: Diagnosis not present

## 2024-01-01 DIAGNOSIS — D2272 Melanocytic nevi of left lower limb, including hip: Secondary | ICD-10-CM | POA: Diagnosis not present

## 2024-01-01 DIAGNOSIS — D2271 Melanocytic nevi of right lower limb, including hip: Secondary | ICD-10-CM | POA: Diagnosis not present

## 2024-01-01 DIAGNOSIS — D225 Melanocytic nevi of trunk: Secondary | ICD-10-CM | POA: Diagnosis not present

## 2024-01-01 DIAGNOSIS — L570 Actinic keratosis: Secondary | ICD-10-CM | POA: Diagnosis not present

## 2024-02-08 ENCOUNTER — Other Ambulatory Visit (HOSPITAL_COMMUNITY): Payer: Self-pay

## 2024-02-15 ENCOUNTER — Other Ambulatory Visit: Payer: Self-pay

## 2024-02-15 ENCOUNTER — Other Ambulatory Visit (HOSPITAL_COMMUNITY): Payer: Self-pay

## 2024-02-15 MED ORDER — LEVOTHYROXINE SODIUM 75 MCG PO TABS
75.0000 ug | ORAL_TABLET | Freq: Every day | ORAL | 3 refills | Status: DC
  Filled 2024-02-15: qty 90, 90d supply, fill #0
  Filled 2024-06-06 – 2024-06-12 (×2): qty 90, 90d supply, fill #1
  Filled 2024-09-12: qty 90, 90d supply, fill #2

## 2024-03-19 ENCOUNTER — Other Ambulatory Visit (HOSPITAL_COMMUNITY): Payer: Self-pay

## 2024-03-19 ENCOUNTER — Other Ambulatory Visit: Payer: Self-pay

## 2024-03-19 MED ORDER — ALLOPURINOL 300 MG PO TABS
300.0000 mg | ORAL_TABLET | Freq: Every day | ORAL | 3 refills | Status: AC
  Filled 2024-03-19: qty 90, 90d supply, fill #0
  Filled 2024-07-15 – 2024-07-22 (×2): qty 90, 90d supply, fill #1
  Filled 2024-10-09 – 2024-10-25 (×2): qty 90, 90d supply, fill #2
  Filled 2024-10-25: qty 90, 90d supply, fill #0

## 2024-04-23 ENCOUNTER — Other Ambulatory Visit: Payer: Self-pay

## 2024-05-01 DIAGNOSIS — N1832 Chronic kidney disease, stage 3b: Secondary | ICD-10-CM | POA: Diagnosis not present

## 2024-05-01 DIAGNOSIS — E034 Atrophy of thyroid (acquired): Secondary | ICD-10-CM | POA: Diagnosis not present

## 2024-05-08 DIAGNOSIS — R972 Elevated prostate specific antigen [PSA]: Secondary | ICD-10-CM | POA: Diagnosis not present

## 2024-05-08 DIAGNOSIS — E7800 Pure hypercholesterolemia, unspecified: Secondary | ICD-10-CM | POA: Diagnosis not present

## 2024-05-08 DIAGNOSIS — N1832 Chronic kidney disease, stage 3b: Secondary | ICD-10-CM | POA: Diagnosis not present

## 2024-05-08 DIAGNOSIS — E034 Atrophy of thyroid (acquired): Secondary | ICD-10-CM | POA: Diagnosis not present

## 2024-05-08 DIAGNOSIS — I10 Essential (primary) hypertension: Secondary | ICD-10-CM | POA: Diagnosis not present

## 2024-06-06 ENCOUNTER — Other Ambulatory Visit (HOSPITAL_COMMUNITY): Payer: Self-pay

## 2024-06-11 ENCOUNTER — Other Ambulatory Visit: Payer: Self-pay

## 2024-06-11 ENCOUNTER — Other Ambulatory Visit (HOSPITAL_COMMUNITY): Payer: Self-pay

## 2024-06-11 MED ORDER — SIMVASTATIN 40 MG PO TABS
40.0000 mg | ORAL_TABLET | Freq: Every day | ORAL | 3 refills | Status: AC
  Filled 2024-06-11: qty 90, 90d supply, fill #0
  Filled 2024-09-06: qty 90, 90d supply, fill #1

## 2024-06-12 ENCOUNTER — Other Ambulatory Visit: Payer: Self-pay

## 2024-06-14 ENCOUNTER — Other Ambulatory Visit (HOSPITAL_COMMUNITY): Payer: Self-pay

## 2024-07-15 ENCOUNTER — Other Ambulatory Visit: Payer: Self-pay

## 2024-07-15 ENCOUNTER — Other Ambulatory Visit (HOSPITAL_COMMUNITY): Payer: Self-pay

## 2024-07-15 MED ORDER — HYDROCHLOROTHIAZIDE 25 MG PO TABS
25.0000 mg | ORAL_TABLET | Freq: Every day | ORAL | 3 refills | Status: AC
  Filled 2024-07-15: qty 90, 90d supply, fill #0
  Filled 2024-10-12: qty 90, 90d supply, fill #1

## 2024-07-15 MED ORDER — LOSARTAN POTASSIUM 25 MG PO TABS
25.0000 mg | ORAL_TABLET | Freq: Every day | ORAL | 3 refills | Status: AC
  Filled 2024-07-15: qty 90, 90d supply, fill #0
  Filled 2024-09-06 – 2024-09-21 (×2): qty 90, 90d supply, fill #1

## 2024-07-22 ENCOUNTER — Other Ambulatory Visit (HOSPITAL_COMMUNITY): Payer: Self-pay

## 2024-07-23 ENCOUNTER — Other Ambulatory Visit (HOSPITAL_COMMUNITY): Payer: Self-pay

## 2024-09-06 ENCOUNTER — Other Ambulatory Visit: Payer: Self-pay

## 2024-09-06 ENCOUNTER — Other Ambulatory Visit (HOSPITAL_COMMUNITY): Payer: Self-pay

## 2024-09-12 ENCOUNTER — Other Ambulatory Visit (HOSPITAL_COMMUNITY): Payer: Self-pay

## 2024-09-12 ENCOUNTER — Other Ambulatory Visit: Payer: Self-pay

## 2024-09-12 MED ORDER — LEVOTHYROXINE SODIUM 75 MCG PO TABS
75.0000 ug | ORAL_TABLET | Freq: Every day | ORAL | 3 refills | Status: AC
  Filled 2024-09-12 – 2024-09-18 (×4): qty 90, 90d supply, fill #0

## 2024-09-13 ENCOUNTER — Other Ambulatory Visit: Payer: Self-pay

## 2024-09-13 ENCOUNTER — Other Ambulatory Visit (HOSPITAL_COMMUNITY): Payer: Self-pay

## 2024-09-18 ENCOUNTER — Other Ambulatory Visit: Payer: Self-pay

## 2024-09-18 ENCOUNTER — Other Ambulatory Visit (HOSPITAL_COMMUNITY): Payer: Self-pay

## 2024-10-09 ENCOUNTER — Other Ambulatory Visit (HOSPITAL_COMMUNITY): Payer: Self-pay

## 2024-10-12 ENCOUNTER — Other Ambulatory Visit (HOSPITAL_COMMUNITY): Payer: Self-pay

## 2024-10-21 ENCOUNTER — Other Ambulatory Visit (HOSPITAL_COMMUNITY): Payer: Self-pay

## 2024-10-25 ENCOUNTER — Other Ambulatory Visit: Payer: Self-pay

## 2024-10-25 ENCOUNTER — Other Ambulatory Visit (HOSPITAL_COMMUNITY): Payer: Self-pay
